# Patient Record
Sex: Male | Born: 1970 | Hispanic: No | Marital: Married | Smoking: Current every day smoker
Health system: Southern US, Community
[De-identification: ages and names within clinical notes are randomized; demographics above are authoritative.]

## PROBLEM LIST (undated history)

## (undated) DIAGNOSIS — E7800 Pure hypercholesterolemia, unspecified: Secondary | ICD-10-CM

---
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
## 2008-03-05 DEATH — deceased

## 2020-04-29 ENCOUNTER — Ambulatory Visit: Payer: Self-pay | Admitting: Cardiology

## 2020-06-06 ENCOUNTER — Ambulatory Visit: Payer: Self-pay | Admitting: Cardiology

## 2020-08-14 ENCOUNTER — Other Ambulatory Visit: Payer: Self-pay | Admitting: Urgent Care

## 2020-08-14 DIAGNOSIS — R222 Localized swelling, mass and lump, trunk: Secondary | ICD-10-CM

## 2020-08-21 ENCOUNTER — Ambulatory Visit
Admission: RE | Admit: 2020-08-21 | Discharge: 2020-08-21 | Disposition: A | Discharge status: Home / Self Care | Payer: 59 | Source: Ambulatory Visit | Attending: Urgent Care | Admitting: Urgent Care

## 2020-08-21 DIAGNOSIS — R222 Localized swelling, mass and lump, trunk: Secondary | ICD-10-CM

## 2021-02-24 DIAGNOSIS — D125 Benign neoplasm of sigmoid colon: Secondary | ICD-10-CM | POA: Diagnosis not present

## 2021-02-24 DIAGNOSIS — Z1211 Encounter for screening for malignant neoplasm of colon: Secondary | ICD-10-CM | POA: Diagnosis not present

## 2021-02-24 DIAGNOSIS — K5730 Diverticulosis of large intestine without perforation or abscess without bleeding: Secondary | ICD-10-CM | POA: Diagnosis not present

## 2021-02-24 DIAGNOSIS — K648 Other hemorrhoids: Secondary | ICD-10-CM | POA: Diagnosis not present

## 2021-02-26 DIAGNOSIS — D125 Benign neoplasm of sigmoid colon: Secondary | ICD-10-CM | POA: Diagnosis not present

## 2021-04-14 DIAGNOSIS — L239 Allergic contact dermatitis, unspecified cause: Secondary | ICD-10-CM | POA: Diagnosis not present

## 2021-04-14 DIAGNOSIS — E785 Hyperlipidemia, unspecified: Secondary | ICD-10-CM | POA: Diagnosis not present

## 2021-04-14 DIAGNOSIS — Z0001 Encounter for general adult medical examination with abnormal findings: Secondary | ICD-10-CM | POA: Diagnosis not present

## 2021-06-09 ENCOUNTER — Ambulatory Visit (HOSPITAL_COMMUNITY)
Admission: RE | Admit: 2021-06-09 | Discharge: 2021-06-09 | Disposition: A | Discharge status: Home / Self Care | Payer: 59 | Source: Ambulatory Visit | Attending: Gerontology | Admitting: Gerontology

## 2021-06-09 ENCOUNTER — Other Ambulatory Visit (HOSPITAL_COMMUNITY): Payer: Self-pay | Admitting: Gerontology

## 2021-06-09 DIAGNOSIS — R059 Cough, unspecified: Secondary | ICD-10-CM | POA: Insufficient documentation

## 2021-06-09 DIAGNOSIS — E785 Hyperlipidemia, unspecified: Secondary | ICD-10-CM | POA: Diagnosis not present

## 2021-06-09 DIAGNOSIS — R918 Other nonspecific abnormal finding of lung field: Secondary | ICD-10-CM | POA: Diagnosis not present

## 2021-06-09 DIAGNOSIS — R69 Illness, unspecified: Secondary | ICD-10-CM | POA: Diagnosis not present

## 2021-06-09 DIAGNOSIS — M47814 Spondylosis without myelopathy or radiculopathy, thoracic region: Secondary | ICD-10-CM | POA: Diagnosis not present

## 2021-06-09 DIAGNOSIS — J309 Allergic rhinitis, unspecified: Secondary | ICD-10-CM | POA: Diagnosis not present

## 2021-08-08 ENCOUNTER — Ambulatory Visit
Admission: EM | Admit: 2021-08-08 | Discharge: 2021-08-08 | Disposition: A | Discharge status: Home / Self Care | Payer: 59 | Attending: Nurse Practitioner | Admitting: Nurse Practitioner

## 2021-08-08 ENCOUNTER — Ambulatory Visit (INDEPENDENT_AMBULATORY_CARE_PROVIDER_SITE_OTHER): Payer: 59

## 2021-08-08 ENCOUNTER — Encounter: Payer: Self-pay | Admitting: Emergency Medicine

## 2021-08-08 ENCOUNTER — Other Ambulatory Visit: Payer: Self-pay

## 2021-08-08 DIAGNOSIS — K8020 Calculus of gallbladder without cholecystitis without obstruction: Secondary | ICD-10-CM | POA: Insufficient documentation

## 2021-08-08 DIAGNOSIS — R1012 Left upper quadrant pain: Secondary | ICD-10-CM | POA: Insufficient documentation

## 2021-08-08 DIAGNOSIS — M546 Pain in thoracic spine: Secondary | ICD-10-CM | POA: Diagnosis not present

## 2021-08-08 DIAGNOSIS — R198 Other specified symptoms and signs involving the digestive system and abdomen: Secondary | ICD-10-CM | POA: Insufficient documentation

## 2021-08-08 DIAGNOSIS — R140 Abdominal distension (gaseous): Secondary | ICD-10-CM | POA: Diagnosis not present

## 2021-08-08 HISTORY — DX: Pure hypercholesterolemia, unspecified: E78.00

## 2021-08-08 LAB — POCT URINALYSIS DIP (MANUAL ENTRY)
Bilirubin, UA: NEGATIVE
Glucose, UA: NEGATIVE mg/dL
Ketones, POC UA: NEGATIVE mg/dL
Leukocytes, UA: NEGATIVE
Nitrite, UA: NEGATIVE
Protein Ur, POC: NEGATIVE mg/dL
Spec Grav, UA: >=1.03 — AB (ref 1.010–1.025)
Urobilinogen, UA: 0.2 E.U./dL
pH, UA: 6 (ref 5.0–8.0)

## 2021-08-08 MED ORDER — ALUM & MAG HYDROXIDE-SIMETH 200-200-20 MG/5ML PO SUSP
30.0000 mL | Freq: Once | ORAL | Status: AC
Start: 2021-08-08 — End: 2021-08-08
  Administered 2021-08-08: 30 mL via ORAL

## 2021-08-08 MED ORDER — IBUPROFEN 800 MG PO TABS: 800.0000 mg | ORAL_TABLET | Freq: Three times a day (TID) | ORAL | 0 refills | Status: DC

## 2021-08-08 MED ORDER — PANTOPRAZOLE SODIUM 40 MG PO TBEC: 40.0000 mg | DELAYED_RELEASE_TABLET | Freq: Every day | ORAL | 0 refills | Status: DC

## 2021-08-08 NOTE — Discharge Instructions (Signed)
Your x-rays show that you have a gallstone.  I would like for you to follow-up with your primary care physician within the next 5 to 7 days for further evaluation and management. Urinalysis also shows blood.  A urine culture has been ordered.  You will be contacted if the urine culture is positive to provide treatment. Take medication as prescribed. As discussed, it may be helpful for you to take turmeric to help with pain and inflammation in your back. Gentle range of motion exercises to help with your back pain. May apply ice or heat as needed.  Apply ice for pain or swelling, heat for spasm or stiffness for your back pain. Go to the emergency department immediately if you develop fever, chills, worsening abdominal pain, nausea, vomiting, or diarrhea. Follow-up as needed.

## 2021-08-08 NOTE — ED Provider Notes (Addendum)
[facility]    CSN: [id number] Arrival date & time: 08/08/21  1508      History   Chief Complaint Chief Complaint  Patient presents with   Back Pain    Entered by patient    HPI Adam Yates is a 51 y.o. male.   The history is provided by the patient.   Patient presents for complaints of left upper quadrant abdominal pain that has been present for the past year.  Patient reports history of left lower back pain and left upper quadrant pain.  He states the left upper quadrant pain has worsened over the past 4 days.  He complains of bloating and gas after eating.  He states the pain feels like "pressure".  Patient states that he has been diagnosed with diverticulosis in the past.  He has had a colonoscopy, which was negative.  He also states that he was treated for his symptoms with muscle relaxers and steroids.  He states neither of these medications helped his symptoms.  He denies fever, chills, nausea, vomiting, diarrhea, urinary symptoms, loss of bowel or bladder function, or radiation of pain into his lower extremities.  Reports his bowel pattern is normal, last bowel movement was today.  Past Medical History:  Diagnosis Date   High cholesterol     There are no problems to display for this patient.   History reviewed. No pertinent surgical history.     Home Medications    Prior to Admission medications   Medication Sig Start Date End Date Taking? Authorizing Provider  ibuprofen (ADVIL) 800 MG tablet Take 1 tablet (800 mg total) by mouth 3 (three) times daily. 08/08/21  Yes Fredricka Kohrs-Warren, Sadie Haber, NP  pantoprazole (PROTONIX) 40 MG tablet Take 1 tablet (40 mg total) by mouth daily. 08/08/21  Yes Keona Bilyeu-Warren, Sadie Haber, NP    Family History History reviewed. No pertinent family history.  Social History Social History   Tobacco Use   Smoking status: Every Day    Packs/day: 0.50    Types: Cigarettes   Smokeless tobacco: Never  Substance Use Topics    Alcohol use: Never   Drug use: Never     Allergies   Avelox [moxifloxacin]   Review of Systems Review of Systems Per HPI  Physical Exam Triage Vital Signs ED Triage Vitals  Enc Vitals Group     BP 08/08/21 1528 130/77     Pulse Rate 08/08/21 1528 (!) 103     Resp 08/08/21 1528 15     Temp 08/08/21 1528 99.3 F (37.4 C)     Temp Source 08/08/21 1528 Oral     SpO2 08/08/21 1528 97 %     Weight --      Height --      Head Circumference --      Peak Flow --      Pain Score 08/08/21 1532 7     Pain Loc --      Pain Edu? --      Excl. in GC? --    No data found.  Updated Vital Signs BP 130/77 (BP Location: Right Arm)   Pulse (!) 103   Temp 99.3 F (37.4 C) (Oral)   Resp 15   SpO2 97%   Visual Acuity Right Eye Distance:   Left Eye Distance:   Bilateral Distance:    Right Eye Near:   Left Eye Near:    Bilateral Near:     Physical Exam Vitals and nursing note  reviewed.  Constitutional:      Appearance: Normal appearance.  HENT:     Head: Normocephalic.  Eyes:     Extraocular Movements: Extraocular movements intact.     Pupils: Pupils are equal, round, and reactive to light.  Cardiovascular:     Rate and Rhythm: Normal rate and regular rhythm.     Pulses: Normal pulses.     Heart sounds: Normal heart sounds.  Pulmonary:     Effort: Pulmonary effort is normal.     Breath sounds: Normal breath sounds.  Abdominal:     General: Bowel sounds are normal.     Palpations: Abdomen is soft.     Tenderness: There is abdominal tenderness (LUQ). There is no guarding.     Comments: Patient noted improvement of symptoms after administration of Maalox.  Musculoskeletal:     Cervical back: Normal range of motion.     Thoracic back: Tenderness (left thoracic spine tenderness at T7-T9) present. No swelling or deformity. Normal range of motion.  Lymphadenopathy:     Cervical: No cervical adenopathy.  Skin:    General: Skin is warm and dry.  Neurological:      General: No focal deficit present.     Mental Status: He is alert and oriented to person, place, and time.  Psychiatric:        Mood and Affect: Mood normal.        Behavior: Behavior normal.      UC Treatments / Results  Labs (all labs ordered are listed, but only abnormal results are displayed) Labs Reviewed  POCT URINALYSIS DIP (MANUAL ENTRY) - Abnormal; Notable for the following components:      Result Value   Spec Grav, UA >=1.030 (*)    Blood, UA trace-intact (*)    All other components within normal limits  URINE CULTURE    EKG   Radiology DG Abd 1 View  Result Date: 08/08/2021 CLINICAL DATA:  Left upper quadrant abdominal pain. EXAM: ABDOMEN - 1 VIEW COMPARISON:  None Available. FINDINGS: The bowel gas pattern is normal. There is average stool burden. There is a rounded density in the right upper quadrant measuring 1.9 cm in diameter. The osseous structures are within normal limits. IMPRESSION: 1. Likely gallstone in the right upper quadrant. This can be further evaluated with ultrasound if clinically warranted. 2. Nonobstructive bowel gas pattern. Electronically Signed   By: Darliss Cheney M.D.   On: 08/08/2021 16:12    Procedures Procedures (including critical care time)  Medications Ordered in UC Medications  alum & mag hydroxide-simeth (MAALOX/MYLANTA) 200-200-20 MG/5ML suspension 30 mL (30 mLs Oral Given 08/08/21 1557)    Initial Impression / Assessment and Plan / UC Course  I have reviewed the triage vital signs and the nursing notes.  Pertinent labs & imaging results that were available during my care of the patient were reviewed by me and considered in my medical decision making (see chart for details).  Patient presents for complaints of left upper quadrant abdominal pain and left middle back pain.  Patient states his symptoms have been present for the past year but worsened over the past 4 days.  On exam, patient has tenderness to the left upper quadrant into  his left paraspinal muscles T7-T9.  X-ray showed a gallstone.  His urine was positive for blood.  Urine culture is pending.  Discussion with patient regarding these findings.  Patient does not display any evidence of cholecystitis or obstruction.  Patient noted some relief  with the administration of Maalox.  We will treat patient symptomatically with ibuprofen and pantoprazole. For his back pain, also recommended the use of turmeric to help with inflammation.  Discussed indications with the patient of when to go to the emergency department, recommended that he go sooner if needed..  Patient was advised to follow-up with his primary care physician within the next 5 to 7 days. Final Clinical Impressions(s) / UC Diagnoses   Final diagnoses:  Abdominal pain, left upper quadrant  Abdominal bloating  Left-sided thoracic back pain, unspecified chronicity  Calculus of gallbladder without cholecystitis without obstruction  Symptoms of gastroesophageal reflux     Discharge Instructions      Your x-rays show that you have a gallstone.  I would like for you to follow-up with your primary care physician within the next 5 to 7 days for further evaluation and management. Urinalysis also shows blood.  A urine culture has been ordered.  You will be contacted if the urine culture is positive to provide treatment. Take medication as prescribed. As discussed, it may be helpful for you to take turmeric to help with pain and inflammation in your back. Gentle range of motion exercises to help with your back pain. May apply ice or heat as needed.  Apply ice for pain or swelling, heat for spasm or stiffness for your back pain. Go to the emergency department immediately if you develop fever, chills, worsening abdominal pain, nausea, vomiting, or diarrhea. Follow-up as needed.      ED Prescriptions     Medication Sig Dispense Auth. Provider   ibuprofen (ADVIL) 800 MG tablet Take 1 tablet (800 mg total) by mouth  3 (three) times daily. 30 tablet Quinnton Bury-Warren, Sadie Haber, NP   pantoprazole (PROTONIX) 40 MG tablet Take 1 tablet (40 mg total) by mouth daily. 30 tablet Marieta Markov-Warren, Sadie Haber, NP      PDMP not reviewed this encounter.   Abran Cantor, NP 08/08/21 1652    Abran Cantor, NP 08/08/21 [phone]

## 2021-08-08 NOTE — ED Triage Notes (Signed)
Pt reports left flank pain that radiates to LUQ of abdomen after eating. Pt reports intermittent discomfort started approximately 4 days ago. Pt reports "I feel bloated after I eat". LBM this am. Denies fever, n/v.

## 2021-08-09 LAB — URINE CULTURE: Culture: NO GROWTH

## 2021-08-18 ENCOUNTER — Ambulatory Visit (HOSPITAL_COMMUNITY)
Admission: RE | Admit: 2021-08-18 | Discharge: 2021-08-18 | Disposition: A | Discharge status: Home / Self Care | Payer: 59 | Source: Ambulatory Visit | Attending: Internal Medicine | Admitting: Internal Medicine

## 2021-08-18 ENCOUNTER — Other Ambulatory Visit (HOSPITAL_COMMUNITY): Payer: Self-pay | Admitting: Internal Medicine

## 2021-08-18 DIAGNOSIS — M546 Pain in thoracic spine: Secondary | ICD-10-CM | POA: Diagnosis not present

## 2021-08-18 DIAGNOSIS — E785 Hyperlipidemia, unspecified: Secondary | ICD-10-CM | POA: Diagnosis not present

## 2021-08-18 DIAGNOSIS — M549 Dorsalgia, unspecified: Secondary | ICD-10-CM | POA: Insufficient documentation

## 2021-08-18 DIAGNOSIS — M47814 Spondylosis without myelopathy or radiculopathy, thoracic region: Secondary | ICD-10-CM | POA: Diagnosis not present

## 2021-08-18 DIAGNOSIS — R1013 Epigastric pain: Secondary | ICD-10-CM | POA: Diagnosis not present

## 2021-09-15 ENCOUNTER — Ambulatory Visit (HOSPITAL_COMMUNITY): Payer: 59 | Attending: Internal Medicine | Admitting: Physical Therapy

## 2021-09-15 ENCOUNTER — Encounter (HOSPITAL_COMMUNITY): Payer: Self-pay | Admitting: Physical Therapy

## 2021-09-15 DIAGNOSIS — R29898 Other symptoms and signs involving the musculoskeletal system: Secondary | ICD-10-CM | POA: Diagnosis not present

## 2021-09-15 DIAGNOSIS — M546 Pain in thoracic spine: Secondary | ICD-10-CM | POA: Insufficient documentation

## 2021-09-15 DIAGNOSIS — M6281 Muscle weakness (generalized): Secondary | ICD-10-CM | POA: Insufficient documentation

## 2021-09-15 NOTE — Therapy (Signed)
OUTPATIENT PHYSICAL THERAPY THORACOLUMBAR EVALUATION   Patient Name: Adam Yates MRN: [id number] DOB:1970/04/18, 51 y.o., male Today's Date: 09/15/2021   PT End of Session - 09/15/21 0812     Visit Number 1    Number of Visits 6    Date for PT Re-Evaluation 10/27/21    Authorization Type [employer]  (VL 35, no auth)    Authorization - Visit Number 1    Authorization - Number of Visits 35    PT Start Time 0815    PT Stop Time 0853    PT Time Calculation (min) 38 min    Activity Tolerance Patient tolerated treatment well    Behavior During Therapy WFL for tasks assessed/performed             Past Medical History:  Diagnosis Date   High cholesterol    History reviewed. No pertinent surgical history. There are no problems to display for this patient.   PCP: Adam Spar, MD   REFERRING PROVIDER: [name], MD   REFERRING DIAG: back pain   Rationale for Evaluation and Treatment Rehabilitation  THERAPY DIAG:  Pain in thoracic spine  Muscle weakness (generalized)  Other symptoms and signs involving the musculoskeletal system  ONSET DATE: about 1 year  SUBJECTIVE:                                                                                                                                                                                           SUBJECTIVE STATEMENT: Patient states back pain and also some chest and L shoulder pain. Symptoms began about a year ago and have been off an on. 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 He also has has chest pain that wraps around from back into L chest.   PERTINENT HISTORY:  Hx back pain  PAIN:  Are you having pain? Yes: NPRS scale: 5-6/10 Pain location: L chest/back Pain description: pressure Aggravating factors: bending, lifting, working Relieving factors: none   PRECAUTIONS: None  WEIGHT BEARING RESTRICTIONS No  FALLS:  Has patient fallen in last 6 months? No  LIVING  ENVIRONMENT: Lives with: lives with their son Lives in: House/apartment Stairs: No Has following equipment at home: None  OCCUPATION: [name]  PLOF: Independent  PATIENT GOALS decrease pain and straighten up back    OBJECTIVE:   DIAGNOSTIC FINDINGS:  XR 08/18/21 IMPRESSION: 1. 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 No acute bony abnormality.  PATIENT SURVEYS:  FOTO 72% function  SCREENING FOR RED FLAGS: Bowel or bladder incontinence: No Spinal tumors: No Cauda equina syndrome: No Compression fracture: No Abdominal aneurysm:  No  COGNITION:  Overall cognitive status: Within functional limits for tasks assessed     SENSATION: WFL   POSTURE: rounded shoulders and forward head  PALPATION: TTP CPA T7-T9 with concordant symptoms, grossly hypomobile mid t/sp  THORACOLUMBAR ROM:   Active  A/PROM  eval  Flexion 0% limited  Extension 0% limited  Right lateral flexion 0% limited  Left lateral flexion 0% limited  Right rotation 25% limited  Left rotation 25% limited   (Blank rows = not tested)  LOWER EXTREMITY ROM:   WFL for tasks assessed  Active  Right eval Left eval  Hip flexion    Hip extension    Hip abduction    Hip adduction    Hip internal rotation    Hip external rotation    Knee flexion    Knee extension    Ankle dorsiflexion    Ankle plantarflexion    Ankle inversion    Ankle eversion     (Blank rows = not tested)  LOWER EXTREMITY MMT:    MMT Right eval Left eval  Hip flexion 5 5  Hip extension 4+ 4+  Hip abduction 4+ 4+  Hip adduction    Hip internal rotation    Hip external rotation    Knee flexion 5 5  Knee extension 5 5  Ankle dorsiflexion    Ankle plantarflexion    Ankle inversion    Ankle eversion     (Blank rows = not tested)  UPPER EXTREMITY ROM: WFL for tasks assessed  UPPER EXTREMITY MMT: MMT Right 09/15/2021 Left 09/15/2021  Shoulder flexion 5 5  Shoulder extension    Shoulder abduction 5 4  Shoulder adduction     Shoulder internal rotation 5 5  Shoulder external rotation 5 4  Middle trapezius    Lower trapezius    Elbow flexion    Elbow extension    Wrist flexion    Wrist extension    Wrist ulnar deviation    Wrist radial deviation    Wrist pronation    Wrist supination    Grip strength (lbs)    (Blank rows = not tested) *= pain     GAIT: Distance walked: 100 feet Assistive device utilized: None Level of assistance: Complete Independence Comments: WFL    TODAY'S TREATMENT  09/15/21 Thoracic extension over chair 10 x 5 second holds Doorway pec stretch 5 x 20 second holds Quadruped thoracic rotation 10 x 5 second holds bilateral    PATIENT EDUCATION:  Education details: Patient educated on exam findings, POC, scope of PT, HEP, and posture. Person educated: Patient Education method: Explanation, Demonstration, and Handouts Education comprehension: verbalized understanding, returned demonstration, verbal cues required, and tactile cues required    HOME EXERCISE PROGRAM: Access Code: [email or web address] URL: [email or web address] Date: 09/15/2021 - Seated Thoracic Lumbar Extension with Pectoralis Stretch  - 3 x daily - 7 x weekly - 2 sets - 10 reps - 5 second hold - Doorway Pec Stretch at 90 Degrees Abduction  - 3 x daily - 7 x weekly - 5 reps - 20 second hold - Quadruped Thoracic Rotation with Hand on Neck  - 3 x daily - 7 x weekly - 2 sets - 10 reps - 5 second  hold  ASSESSMENT:  CLINICAL IMPRESSION: Patient a 51 y.o. y.o. male who was seen today for physical therapy evaluation and treatment for back pain. Patient presents with pain and deficits in thoracic spine strength, ROM, endurance, activity tolerance, and functional mobility with ADL. Patient  is having to modify and restrict ADL as indicated by outcome measure score as well as subjective information and objective measures which is affecting overall participation. Patient will benefit from skilled physical therapy  in order to improve function and reduce impairment.   OBJECTIVE IMPAIRMENTS decreased activity tolerance, decreased endurance, decreased mobility, difficulty walking, decreased ROM, decreased strength, hypomobility, increased muscle spasms, impaired flexibility, impaired UE functional use, improper body mechanics, postural dysfunction, and pain.   ACTIVITY LIMITATIONS carrying, lifting, reach over head, locomotion level, and caring for others  PARTICIPATION LIMITATIONS: cleaning, community activity, occupation, and yard work  PERSONAL FACTORS Time since onset of injury/illness/exacerbation and 1 comorbidity: hx back pain  are also affecting patient's functional outcome.   REHAB POTENTIAL: Good  CLINICAL DECISION MAKING: Stable/uncomplicated  EVALUATION COMPLEXITY: Low   GOALS: Goals reviewed with patient? Yes  SHORT TERM GOALS: Target date: 10/06/2021  Patient will be independent with HEP in order to improve functional outcomes. Baseline:  Goal status: INITIAL  2.  Patient will report at least 25% improvement in symptoms for improved quality of life. Baseline:  Goal status: INITIAL   LONG TERM GOALS: Target date: 10/27/2021  Patient will report at least 75% improvement in symptoms for improved quality of life. Baseline:  Goal status: INITIAL  2.  Patient will improve FOTO score by at least 7 points in order to indicate improved tolerance to activity. Baseline: 72% function Goal status: INITIAL  3.  Patient will demonstrate upright posture for at least 10 minutes without cueing for improved postural awareness and strength. Baseline: forward head, rounded shoulders, kyphotic Goal status: INITIAL  4.  Patient will be able to return to all activities unrestricted for improved ability to perform work functions and participate with family.  Baseline: restricted Goal status: INITIAL     PLAN: PT FREQUENCY: 1x/week  PT DURATION: 6 weeks  PLANNED INTERVENTIONS:  Therapeutic exercises, Therapeutic activity, Neuromuscular re-education, Balance training, Gait training, Patient/Family education, Joint manipulation, Joint mobilization, Stair training, Orthotic/Fit training, DME instructions, Aquatic Therapy, Dry Needling, Electrical stimulation, Spinal manipulation, Spinal mobilization, Cryotherapy, Moist heat, Compression bandaging, scar mobilization, Splintting, Taping, Traction, Ultrasound, Ionotophoresis 4mg /ml Dexamethasone, and Manual therapy   PLAN FOR NEXT SESSION: f/u with HEP, thoracic mobility, postural strengthening, pec length, possibly thoracic mobs   Moua Rasmusson, PT 09/15/2021, 9:01 AM

## 2021-09-22 ENCOUNTER — Ambulatory Visit (HOSPITAL_COMMUNITY): Payer: 59

## 2021-09-22 DIAGNOSIS — M546 Pain in thoracic spine: Secondary | ICD-10-CM | POA: Diagnosis not present

## 2021-09-22 DIAGNOSIS — R29898 Other symptoms and signs involving the musculoskeletal system: Secondary | ICD-10-CM | POA: Diagnosis not present

## 2021-09-22 DIAGNOSIS — M6281 Muscle weakness (generalized): Secondary | ICD-10-CM | POA: Diagnosis not present

## 2021-09-22 NOTE — Therapy (Signed)
OUTPATIENT PHYSICAL THERAPY THORACOLUMBAR PROGRESS   Patient Name: Adam Yates MRN: [id number] DOB:1970-03-06, 51 y.o., male Today's Date: 09/22/2021   PT End of Session - 09/22/21 1559     Visit Number 2    Number of Visits 6    Date for PT Re-Evaluation 10/27/21    Authorization Type [employer]  (VL [id number], no auth)    Authorization - Visit Number 1    Authorization - Number of Visits 35    PT Start Time 1600    PT Stop Time 1642    PT Time Calculation (min) 42 min    Activity Tolerance Patient tolerated treatment well    Behavior During Therapy WFL for tasks assessed/performed              Past Medical History:  Diagnosis Date   High cholesterol    No past surgical history on file. There are no problems to display for this patient.   PCP: [name], MD   REFERRING PROVIDER: [name], MD   REFERRING DIAG: back pain   Rationale for Evaluation and Treatment Rehabilitation  THERAPY DIAG:  Pain in thoracic spine  Muscle weakness (generalized)  Other symptoms and signs involving the musculoskeletal system  ONSET DATE: about 1 year  SUBJECTIVE:                                                                                                                                                                                           SUBJECTIVE STATEMENT:  Patient reports he felt a little better after treatment but did some handyman work over the weekend and it flared back up some.   PERTINENT HISTORY:  Hx back pain  PAIN:  Are you having pain? Yes: NPRS scale: 6/10 Pain location: L chest/back Pain description: pressure Aggravating factors: bending, lifting, working Relieving factors: none   PRECAUTIONS: None  WEIGHT BEARING RESTRICTIONS No  FALLS:  Has patient fallen in last 6 months? No  LIVING ENVIRONMENT: Lives with: lives with their son Lives in: House/apartment Stairs: No Has following equipment at home:  None  OCCUPATION: [name]  PLOF: Independent  PATIENT GOALS decrease pain and straighten up back    OBJECTIVE:   DIAGNOSTIC FINDINGS:  XR 08/18/21 IMPRESSION: 1. 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 No acute bony abnormality.  PATIENT SURVEYS:  FOTO 72% function  SCREENING FOR RED FLAGS: Bowel or bladder incontinence: No Spinal tumors: No Cauda equina syndrome: No Compression fracture: No Abdominal aneurysm: No  COGNITION:  Overall cognitive status: Within functional limits for tasks assessed     SENSATION: WFL   POSTURE: rounded shoulders and  forward head  PALPATION: TTP CPA T7-T9 with concordant symptoms, grossly hypomobile mid t/sp  THORACOLUMBAR ROM:   Active  A/PROM  eval  Flexion 0% limited  Extension 0% limited  Right lateral flexion 0% limited  Left lateral flexion 0% limited  Right rotation 25% limited  Left rotation 25% limited   (Blank rows = not tested)  LOWER EXTREMITY ROM:   WFL for tasks assessed  Active  Right eval Left eval  Hip flexion    Hip extension    Hip abduction    Hip adduction    Hip internal rotation    Hip external rotation    Knee flexion    Knee extension    Ankle dorsiflexion    Ankle plantarflexion    Ankle inversion    Ankle eversion     (Blank rows = not tested)  LOWER EXTREMITY MMT:    MMT Right eval Left eval  Hip flexion 5 5  Hip extension 4+ 4+  Hip abduction 4+ 4+  Hip adduction    Hip internal rotation    Hip external rotation    Knee flexion 5 5  Knee extension 5 5  Ankle dorsiflexion    Ankle plantarflexion    Ankle inversion    Ankle eversion     (Blank rows = not tested)  UPPER EXTREMITY ROM: WFL for tasks assessed  UPPER EXTREMITY MMT: MMT Right 09/15/2021 Left 09/15/2021  Shoulder flexion 5 5  Shoulder extension    Shoulder abduction 5 4  Shoulder adduction    Shoulder internal rotation 5 5  Shoulder external rotation 5 4  Middle trapezius    Lower trapezius    Elbow  flexion    Elbow extension    Wrist flexion    Wrist extension    Wrist ulnar deviation    Wrist radial deviation    Wrist pronation    Wrist supination    Grip strength (lbs)    (Blank rows = not tested) *= pain     GAIT: Distance walked: 100 feet Assistive device utilized: None Level of assistance: Complete Independence Comments: WFL    TODAY'S TREATMENT  09/22/21 Sitting: Thoracic extension over chair 5" x 10  Standing: Doorway pec stretch 5 x 20 second holds  Quadruped  thoracic rotation 10 x 5 second holds bilateral   Sidelying thoracic rotation/ open book stretch x 10 each side  Prone: Prone press up x 10 Prone Grade 2 mobilizations to thoracic spine x 10  Supine: On 1/2 foam roll stretch On 1/2 foam roll GTB shoulder horizontal abduction 2 x 10 On 1/2 foam roll  with roll horizontal x 10 stretch     09/15/21 Thoracic extension over chair 10 x 5 second holds Doorway pec stretch 5 x 20 second holds Quadruped thoracic rotation 10 x 5 second holds bilateral    PATIENT EDUCATION:  Education details: Patient educated on exam findings, POC, scope of PT, HEP, and posture. Person educated: Patient Education method: Explanation, Demonstration, and Handouts Education comprehension: verbalized understanding, returned demonstration, verbal cues required, and tactile cues required    HOME EXERCISE PROGRAM: Access Code: [email or web address] URL: [email or web address] Date: 09/22/2021 Prepared by: AP - Rehab  Exercises - Seated Thoracic Lumbar Extension with Pectoralis Stretch  - 3 x daily - 7 x weekly - 2 sets - 10 reps - 5 second hold - Doorway Pec Stretch at 90 Degrees Abduction  - 3 x daily - 7 x weekly - 5 reps - 20 second  hold - Architect with Hand on Neck  - 3 x daily - 7 x weekly - 2 sets - 10 reps - 5 second  hold - Sidelying Open Book Thoracic Lumbar Rotation and Extension  - 3 x daily - 7 x weekly - 1 sets - 10 reps - Prone  Press Up  - 1 x daily - 7 x weekly - 1 sets - 10 reps - Supine Chest Stretch on Foam Roll  - 1 x daily - 7 x weekly - 1 sets - 10 reps - Supine Thoracic Mobilization Foam Roll Horizontal with Arm Stretch  - 1 x daily - 7 x weekly - 1 sets - 10 reps  Access Code: [email or web address] URL: [email or web address] Date: 09/15/2021 - Seated Thoracic Lumbar Extension with Pectoralis Stretch  - 3 x daily - 7 x weekly - 2 sets - 10 reps - 5 second hold - Doorway Pec Stretch at 90 Degrees Abduction  - 3 x daily - 7 x weekly - 5 reps - 20 second hold - Quadruped Thoracic Rotation with Hand on Neck  - 3 x daily - 7 x weekly - 2 sets - 10 reps - 5 second  hold  ASSESSMENT:  CLINICAL IMPRESSION: Today's session started with review of HEP and goals.  Patient verbalizes agreement with set rehab goals. Added exercise to continue to address thoracic mobility; gentle anterior mobs of the thoracic spine; updated HEP. Patient reports no rib/ chest area pain after treatment only some soreness in his back. Patient will benefit from skilled physical therapy in order to improve function and reduce impairment.   OBJECTIVE IMPAIRMENTS decreased activity tolerance, decreased endurance, decreased mobility, difficulty walking, decreased ROM, decreased strength, hypomobility, increased muscle spasms, impaired flexibility, impaired UE functional use, improper body mechanics, postural dysfunction, and pain.   ACTIVITY LIMITATIONS carrying, lifting, reach over head, locomotion level, and caring for others  PARTICIPATION LIMITATIONS: cleaning, community activity, occupation, and yard work  PERSONAL FACTORS Time since onset of injury/illness/exacerbation and 1 comorbidity: hx back pain  are also affecting patient's functional outcome.   REHAB POTENTIAL: Good  CLINICAL DECISION MAKING: Stable/uncomplicated  EVALUATION COMPLEXITY: Low   GOALS: Goals reviewed with patient? Yes  SHORT TERM GOALS: Target date:  10/06/2021  Patient will be independent with HEP in order to improve functional outcomes. Baseline:  Goal status: ongoing  2.  Patient will report at least 25% improvement in symptoms for improved quality of life. Baseline:  Goal status: ongoing   LONG TERM GOALS: Target date: 10/27/2021  Patient will report at least 75% improvement in symptoms for improved quality of life. Baseline:  Goal status: ongoing  2.  Patient will improve FOTO score by at least 7 points in order to indicate improved tolerance to activity. Baseline: 72% function Goal status: ongoing  3.  Patient will demonstrate upright posture for at least 10 minutes without cueing for improved postural awareness and strength. Baseline: forward head, rounded shoulders, kyphotic Goal status: ongoing  4.  Patient will be able to return to all activities unrestricted for improved ability to perform work functions and participate with family.  Baseline: restricted Goal status: ongoing     PLAN: PT FREQUENCY: 1x/week  PT DURATION: 6 weeks  PLANNED INTERVENTIONS: Therapeutic exercises, Therapeutic activity, Neuromuscular re-education, Balance training, Gait training, Patient/Family education, Joint manipulation, Joint mobilization, Stair training, Orthotic/Fit training, DME instructions, Aquatic Therapy, Dry Needling, Electrical stimulation, Spinal manipulation, Spinal mobilization, Cryotherapy, Moist heat, Compression bandaging, scar mobilization, Splintting, Taping, Traction, Ultrasound,  Ionotophoresis 4mg /ml Dexamethasone, and Manual therapy   PLAN FOR NEXT SESSION: f/u with HEP, thoracic mobility, postural strengthening, pec length, possibly thoracic mobs   4:44 PM, 09/22/21 Zane Samson Small Karver Fadden MPT [facility] [place] [phone] Ph:[phone]

## 2021-09-29 ENCOUNTER — Ambulatory Visit (HOSPITAL_COMMUNITY): Payer: 59

## 2021-10-13 ENCOUNTER — Ambulatory Visit (HOSPITAL_COMMUNITY): Payer: 59 | Attending: Internal Medicine | Admitting: Physical Therapy

## 2021-10-13 ENCOUNTER — Encounter (HOSPITAL_COMMUNITY): Payer: Self-pay | Admitting: Physical Therapy

## 2021-10-13 DIAGNOSIS — M546 Pain in thoracic spine: Secondary | ICD-10-CM | POA: Diagnosis not present

## 2021-10-13 DIAGNOSIS — R29898 Other symptoms and signs involving the musculoskeletal system: Secondary | ICD-10-CM | POA: Diagnosis not present

## 2021-10-13 DIAGNOSIS — M6281 Muscle weakness (generalized): Secondary | ICD-10-CM | POA: Insufficient documentation

## 2021-10-13 NOTE — Therapy (Signed)
OUTPATIENT PHYSICAL THERAPY THORACOLUMBAR PROGRESS   Patient Name: Adam Yates MRN: [id number] DOB:December 18, 1970, 51 y.o., male Today's Date: 10/13/2021   PT End of Session - 10/13/21 1434     Visit Number 3    Number of Visits 6    Date for PT Re-Evaluation 10/27/21    Authorization Type [employer]et[employer]  (VL 35, no auth)    Authorization - Visit Number 3    Authorization - Number of Visits 35    PT Start Time 1434    PT Stop Time 1512    PT Time Calculation (min) 38 min    Activity Tolerance Patient tolerated treatment well    Behavior During Therapy WFL for tasks assessed/performed              Past Medical History:  Diagnosis Date   High cholesterol    History reviewed. No pertinent surgical history. There are no problems to display for this patient.   PCP: [name], MD   REFERRING PROVIDER: [name], MD   REFERRING DIAG: back pain   Rationale for Evaluation and Treatment Rehabilitation  THERAPY DIAG:  Pain in thoracic spine  Muscle weakness (generalized)  Other symptoms and signs involving the musculoskeletal system  ONSET DATE: about 1 year  SUBJECTIVE:                                                                                                                                                                                           SUBJECTIVE STATEMENT: Patient states his R shoulder has been bothering him. Some days his chest/back doesn't bother him and some days it does. Exercises have been going well but the shoulder hurts from horizontal abduction exercise.   PERTINENT HISTORY:  Hx back pain  PAIN:  Are you having pain? Yes: NPRS scale: 6/10 Pain location: L chest/back Pain description: pressure Aggravating factors: bending, lifting, working Relieving factors: none   PRECAUTIONS: None  WEIGHT BEARING RESTRICTIONS No  FALLS:  Has patient fallen in last 6 months? No  LIVING ENVIRONMENT: Lives with: lives with  their son Lives in: House/apartment Stairs: No Has following equipment at home: None  OCCUPATION: [name]  PLOF: Independent  PATIENT GOALS decrease pain and straighten up back    OBJECTIVE:   DIAGNOSTIC FINDINGS:  XR 08/18/21 IMPRESSION: 1. 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 No acute bony abnormality.  PATIENT SURVEYS:  FOTO 72% function  SCREENING FOR RED FLAGS: Bowel or bladder incontinence: No Spinal tumors: No Cauda equina syndrome: No Compression fracture: No Abdominal aneurysm: No  COGNITION:  Overall cognitive status: Within functional limits for tasks assessed  SENSATION: WFL   POSTURE: rounded shoulders and forward head  PALPATION: TTP CPA T7-T9 with concordant symptoms, grossly hypomobile mid t/sp  THORACOLUMBAR ROM:   Active  A/PROM  eval  Flexion 0% limited  Extension 0% limited  Right lateral flexion 0% limited  Left lateral flexion 0% limited  Right rotation 25% limited  Left rotation 25% limited   (Blank rows = not tested)  LOWER EXTREMITY ROM:   WFL for tasks assessed  Active  Right eval Left eval  Hip flexion    Hip extension    Hip abduction    Hip adduction    Hip internal rotation    Hip external rotation    Knee flexion    Knee extension    Ankle dorsiflexion    Ankle plantarflexion    Ankle inversion    Ankle eversion     (Blank rows = not tested)  LOWER EXTREMITY MMT:    MMT Right eval Left eval  Hip flexion 5 5  Hip extension 4+ 4+  Hip abduction 4+ 4+  Hip adduction    Hip internal rotation    Hip external rotation    Knee flexion 5 5  Knee extension 5 5  Ankle dorsiflexion    Ankle plantarflexion    Ankle inversion    Ankle eversion     (Blank rows = not tested)  UPPER EXTREMITY ROM: WFL for tasks assessed  UPPER EXTREMITY MMT: MMT Right 09/15/2021 Left 09/15/2021  Shoulder flexion 5 5  Shoulder extension    Shoulder abduction 5 4  Shoulder adduction    Shoulder internal rotation 5 5   Shoulder external rotation 5 4  Middle trapezius    Lower trapezius    Elbow flexion    Elbow extension    Wrist flexion    Wrist extension    Wrist ulnar deviation    Wrist radial deviation    Wrist pronation    Wrist supination    Grip strength (lbs)    (Blank rows = not tested) *= pain     GAIT: Distance walked: 100 feet Assistive device utilized: None Level of assistance: Complete Independence Comments: WFL    TODAY'S TREATMENT  10/13/21 Scapular retraction with GH ER GTB 2x 15 Shoulder horizontal abduction GTB 2 x 15 PNF D2 pattern GTB 2 x 10  Row GTB 2x 15 Shoulder extension GTB 2x 15  Seated cervical retraction 2x 15   09/22/21 Sitting: Thoracic extension over chair 5" x 10  Standing: Doorway pec stretch 5 x 20 second holds  Quadruped  thoracic rotation 10 x 5 second holds bilateral   Sidelying thoracic rotation/ open book stretch x 10 each side  Prone: Prone press up x 10 Prone Grade 2 mobilizations to thoracic spine x 10  Supine: On 1/2 foam roll stretch On 1/2 foam roll GTB shoulder horizontal abduction 2 x 10 On 1/2 foam roll  with roll horizontal x 10 stretch   09/15/21 Thoracic extension over chair 10 x 5 second holds Doorway pec stretch 5 x 20 second holds Quadruped thoracic rotation 10 x 5 second holds bilateral    PATIENT EDUCATION:  Education details: Patient educated on exam findings, POC, scope of PT, HEP, and posture. Person educated: Patient Education method: Explanation, Demonstration, and Handouts Education comprehension: verbalized understanding, returned demonstration, verbal cues required, and tactile cues required    HOME EXERCISE PROGRAM: Access Code: [email or web address] URL: [email or web address] 10/13/21- Shoulder External Rotation and Scapular Retraction with Resistance  - 1  x daily - 7 x weekly - 2 sets - 15 reps - Standing Shoulder Horizontal Abduction with Resistance  - 1 x daily - 7 x weekly - 2 sets - 15  reps - Standing Shoulder Single Arm PNF D2 Flexion with Resistance  - 1 x daily - 7 x weekly - 2 sets - 10 reps - Standing Shoulder Row with Anchored Resistance  - 1 x daily - 7 x weekly - 2 sets - 10 reps - Shoulder extension with resistance - Neutral  - 1 x daily - 7 x weekly - 2 sets - 15 reps   Date: 09/22/2021 Prepared by: AP - Rehab  Exercises - Seated Thoracic Lumbar Extension with Pectoralis Stretch  - 3 x daily - 7 x weekly - 2 sets - 10 reps - 5 second hold - Doorway Pec Stretch at 90 Degrees Abduction  - 3 x daily - 7 x weekly - 5 reps - 20 second hold - Quadruped Thoracic Rotation with Hand on Neck  - 3 x daily - 7 x weekly - 2 sets - 10 reps - 5 second  hold - Sidelying Open Book Thoracic Lumbar Rotation and Extension  - 3 x daily - 7 x weekly - 1 sets - 10 reps - Prone Press Up  - 1 x daily - 7 x weekly - 1 sets - 10 reps - Supine Chest Stretch on Foam Roll  - 1 x daily - 7 x weekly - 1 sets - 10 reps - Supine Thoracic Mobilization Foam Roll Horizontal with Arm Stretch  - 1 x daily - 7 x weekly - 1 sets - 10 reps  Access Code: [email or web address] URL: [email or web address] Date: 09/15/2021 - Seated Thoracic Lumbar Extension with Pectoralis Stretch  - 3 x daily - 7 x weekly - 2 sets - 10 reps - 5 second hold - Doorway Pec Stretch at 90 Degrees Abduction  - 3 x daily - 7 x weekly - 5 reps - 20 second hold - Quadruped Thoracic Rotation with Hand on Neck  - 3 x daily - 7 x weekly - 2 sets - 10 reps - 5 second  hold  ASSESSMENT:  CLINICAL IMPRESSION: Patient with increased R shoulder pain today but decreasing original symptoms. Began additional postural strengthening exercises which are tolerated well and he is given intermittent cueing to perform in pain reduced range. Patient educated on importance of posture on shoulder and cervical spine mechanics. He requires verbal and tactile cueing for cervical retractions with good/fair carry over. Patient will continue to benefit  from physical therapy in order to improve function and reduce impairment.    OBJECTIVE IMPAIRMENTS decreased activity tolerance, decreased endurance, decreased mobility, difficulty walking, decreased ROM, decreased strength, hypomobility, increased muscle spasms, impaired flexibility, impaired UE functional use, improper body mechanics, postural dysfunction, and pain.   ACTIVITY LIMITATIONS carrying, lifting, reach over head, locomotion level, and caring for others  PARTICIPATION LIMITATIONS: cleaning, community activity, occupation, and yard work  PERSONAL FACTORS Time since onset of injury/illness/exacerbation and 1 comorbidity: hx back pain  are also affecting patient's functional outcome.   REHAB POTENTIAL: Good  CLINICAL DECISION MAKING: Stable/uncomplicated  EVALUATION COMPLEXITY: Low   GOALS: Goals reviewed with patient? Yes  SHORT TERM GOALS: Target date: 10/06/2021  Patient will be independent with HEP in order to improve functional outcomes. Baseline:  Goal status: ongoing  2.  Patient will report at least 25% improvement in symptoms for improved quality of life. Baseline:  Goal status: ongoing  LONG TERM GOALS: Target date: 10/27/2021  Patient will report at least 75% improvement in symptoms for improved quality of life. Baseline:  Goal status: ongoing  2.  Patient will improve FOTO score by at least 7 points in order to indicate improved tolerance to activity. Baseline: 72% function Goal status: ongoing  3.  Patient will demonstrate upright posture for at least 10 minutes without cueing for improved postural awareness and strength. Baseline: forward head, rounded shoulders, kyphotic Goal status: ongoing  4.  Patient will be able to return to all activities unrestricted for improved ability to perform work functions and participate with family.  Baseline: restricted Goal status: ongoing     PLAN: PT FREQUENCY: 1x/week  PT DURATION: 6 weeks  PLANNED  INTERVENTIONS: Therapeutic exercises, Therapeutic activity, Neuromuscular re-education, Balance training, Gait training, Patient/Family education, Joint manipulation, Joint mobilization, Stair training, Orthotic/Fit training, DME instructions, Aquatic Therapy, Dry Needling, Electrical stimulation, Spinal manipulation, Spinal mobilization, Cryotherapy, Moist heat, Compression bandaging, scar mobilization, Splintting, Taping, Traction, Ultrasound, Ionotophoresis 4mg /ml Dexamethasone, and Manual therapy   PLAN FOR NEXT SESSION: f/u with HEP, thoracic mobility, postural strengthening, pec length, possibly thoracic mobs   2:35 PM, 10/13/21 12/13/21 PT, DPT Physical Therapist at [facility]

## 2021-10-20 ENCOUNTER — Ambulatory Visit (HOSPITAL_COMMUNITY): Payer: 59

## 2021-10-20 DIAGNOSIS — M6281 Muscle weakness (generalized): Secondary | ICD-10-CM | POA: Diagnosis not present

## 2021-10-20 DIAGNOSIS — M546 Pain in thoracic spine: Secondary | ICD-10-CM | POA: Diagnosis not present

## 2021-10-20 DIAGNOSIS — R29898 Other symptoms and signs involving the musculoskeletal system: Secondary | ICD-10-CM | POA: Diagnosis not present

## 2021-10-20 NOTE — Therapy (Signed)
OUTPATIENT PHYSICAL THERAPY THORACOLUMBAR PROGRESS   Patient Name: Adam Yates MRN: [id number] DOB:05-30-1970, 51 y.o., male Today's Date: 10/20/2021   PT End of Session - 10/20/21 1435     Visit Number 4    Number of Visits 6    Date for PT Re-Evaluation 10/27/21    Authorization Type [employer]  (VL 35, no auth)    Authorization - Visit Number 4    Authorization - Number of Visits 35    PT Start Time 1432    PT Stop Time 1514    PT Time Calculation (min) 42 min    Activity Tolerance Patient tolerated treatment well    Behavior During Therapy WFL for tasks assessed/performed               Past Medical History:  Diagnosis Date   High cholesterol    No past surgical history on file. There are no problems to display for this patient.   PCP: Carrolyn Meiers, MD   REFERRING PROVIDER: Carrolyn Meiers, MD   REFERRING DIAG: back pain   Rationale for Evaluation and Treatment Rehabilitation  THERAPY DIAG:  Pain in thoracic spine  Muscle weakness (generalized)  Other symptoms and signs involving the musculoskeletal system  ONSET DATE: about 1 year  SUBJECTIVE:                                                                                                                                                                                           SUBJECTIVE STATEMENT: Right shoulder "pressure" and pain; at about 50%; patient reports no pain chest and back today but rested over the weekend. Reports he purchased a 1/2 foam roll for home use.   PERTINENT HISTORY:  Hx back pain  PAIN:  Are you having pain? Yes: NPRS scale: 5/10 Pain location: right shoulder Pain description: pressure Aggravating factors: bending, lifting, working Relieving factors: none   PRECAUTIONS: None  WEIGHT BEARING RESTRICTIONS No  FALLS:  Has patient fallen in last 6 months? No  LIVING ENVIRONMENT: Lives with: lives with their son Lives in: House/apartment Stairs:  No Has following equipment at home: None  OCCUPATION: [employer]  PLOF: Independent  PATIENT GOALS decrease pain and straighten up back    OBJECTIVE:   DIAGNOSTIC FINDINGS:  XR 08/18/21 IMPRESSION: 1. 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 No acute bony abnormality.  PATIENT SURVEYS:  FOTO 72% function  SCREENING FOR RED FLAGS: Bowel or bladder incontinence: No Spinal tumors: No Cauda equina syndrome: No Compression fracture: No Abdominal aneurysm: No  COGNITION:  Overall cognitive status: Within functional limits for tasks assessed  SENSATION: WFL   POSTURE: rounded shoulders and forward head  PALPATION: TTP CPA T7-T9 with concordant symptoms, grossly hypomobile mid t/sp  THORACOLUMBAR ROM:   Active  A/PROM  eval  Flexion 0% limited  Extension 0% limited  Right lateral flexion 0% limited  Left lateral flexion 0% limited  Right rotation 25% limited  Left rotation 25% limited   (Blank rows = not tested)  LOWER EXTREMITY ROM:   WFL for tasks assessed  Active  Right eval Left eval  Hip flexion    Hip extension    Hip abduction    Hip adduction    Hip internal rotation    Hip external rotation    Knee flexion    Knee extension    Ankle dorsiflexion    Ankle plantarflexion    Ankle inversion    Ankle eversion     (Blank rows = not tested)  LOWER EXTREMITY MMT:    MMT Right eval Left eval  Hip flexion 5 5  Hip extension 4+ 4+  Hip abduction 4+ 4+  Hip adduction    Hip internal rotation    Hip external rotation    Knee flexion 5 5  Knee extension 5 5  Ankle dorsiflexion    Ankle plantarflexion    Ankle inversion    Ankle eversion     (Blank rows = not tested)  UPPER EXTREMITY ROM: WFL for tasks assessed  UPPER EXTREMITY MMT: MMT Right 09/15/2021 Left 09/15/2021  Shoulder flexion 5 5  Shoulder extension    Shoulder abduction 5 4  Shoulder adduction    Shoulder internal rotation 5 5  Shoulder external rotation 5 4  Middle  trapezius    Lower trapezius    Elbow flexion    Elbow extension    Wrist flexion    Wrist extension    Wrist ulnar deviation    Wrist radial deviation    Wrist pronation    Wrist supination    Grip strength (lbs)    (Blank rows = not tested) *= pain     GAIT: Distance walked: 100 feet Assistive device utilized: None Level of assistance: Complete Independence Comments: WFL    TODAY'S TREATMENT  10/20/21 UBE backwards x 3'  Sitting: chair thoracic extension x 15 Cervical retractions x 15 physioball roll out (blue) for back flexion stretch x 2'  Supine: 1/2 foam roll horizontal stretch x 10 1/2 foam roll vertical stretch x 1' 1/2 foam roll vertical march with abdominal set  x 10 each  Standing: Open book stretch x 10 each Wall Pec stretch 10" hold x 5 each Doorway stretch 5 x 20" GTB ER 2 x 10  Prone: Thoracic mobs grade 2        10/13/21 Scapular retraction with GH ER GTB 2x 15 Shoulder horizontal abduction GTB 2 x 15 PNF D2 pattern GTB 2 x 10  Row GTB 2x 15 Shoulder extension GTB 2x 15  Seated cervical retraction 2x 15   09/22/21 Sitting: Thoracic extension over chair 5" x 10  Standing: Doorway pec stretch 5 x 20 second holds  Quadruped  thoracic rotation 10 x 5 second holds bilateral   Sidelying thoracic rotation/ open book stretch x 10 each side  Prone: Prone press up x 10 Prone Grade 2 mobilizations to thoracic spine x 10  Supine: On 1/2 foam roll stretch On 1/2 foam roll GTB shoulder horizontal abduction 2 x 10 On 1/2 foam roll  with roll horizontal x 10 stretch  09/15/21 Thoracic extension over chair 10 x 5 second holds Doorway pec stretch 5 x 20 second holds Quadruped thoracic rotation 10 x 5 second holds bilateral    PATIENT EDUCATION:  Education details: Patient educated on exam findings, POC, scope of PT, HEP, and posture. Person educated: Patient Education method: Explanation, Demonstration, and Handouts Education  comprehension: verbalized understanding, returned demonstration, verbal cues required, and tactile cues required    HOME EXERCISE PROGRAM: Access Code: [email or web address] URL: [email or web address] 10/13/21- Shoulder External Rotation and Scapular Retraction with Resistance  - 1 x daily - 7 x weekly - 2 sets - 15 reps - Standing Shoulder Horizontal Abduction with Resistance  - 1 x daily - 7 x weekly - 2 sets - 15 reps - Standing Shoulder Single Arm PNF D2 Flexion with Resistance  - 1 x daily - 7 x weekly - 2 sets - 10 reps - Standing Shoulder Row with Anchored Resistance  - 1 x daily - 7 x weekly - 2 sets - 10 reps - Shoulder extension with resistance - Neutral  - 1 x daily - 7 x weekly - 2 sets - 15 reps   Date: 09/22/2021 Prepared by: [facility] - Rehab  Exercises - Seated Thoracic Lumbar Extension with Pectoralis Stretch  - 3 x daily - 7 x weekly - 2 sets - 10 reps - 5 second hold - Doorway Pec Stretch at 90 Degrees Abduction  - 3 x daily - 7 x weekly - 5 reps - 20 second hold - Quadruped Thoracic Rotation with Hand on Neck  - 3 x daily - 7 x weekly - 2 sets - 10 reps - 5 second  hold - Sidelying Open Book Thoracic Lumbar Rotation and Extension  - 3 x daily - 7 x weekly - 1 sets - 10 reps - Prone Press Up  - 1 x daily - 7 x weekly - 1 sets - 10 reps - Supine Chest Stretch on Foam Roll  - 1 x daily - 7 x weekly - 1 sets - 10 reps - Supine Thoracic Mobilization Foam Roll Horizontal with Arm Stretch  - 1 x daily - 7 x weekly - 1 sets - 10 reps  Access Code: [email or web address] URL: [email or web address] Date: 09/15/2021 - Seated Thoracic Lumbar Extension with Pectoralis Stretch  - 3 x daily - 7 x weekly - 2 sets - 10 reps - 5 second hold - Doorway Pec Stretch at 90 Degrees Abduction  - 3 x daily - 7 x weekly - 5 reps - 20 second hold - Quadruped Thoracic Rotation with Hand on Neck  - 3 x daily - 7 x weekly - 2 sets - 10 reps - 5 second  hold  ASSESSMENT:  CLINICAL  IMPRESSION: Patient with minimal compliant of back, chest pain today.  Continues with pain right shoulder; tender right AC joint area. Trial of thoracic mobs; patient with noted significant tightness right side paraspinals versus left. Added open book stretch in standing to HEP.  Patient overall continues with forward head and rounded shoulders.  Patient will continue to benefit from physical therapy in order to improve function and reduce impairment.    OBJECTIVE IMPAIRMENTS decreased activity tolerance, decreased endurance, decreased mobility, difficulty walking, decreased ROM, decreased strength, hypomobility, increased muscle spasms, impaired flexibility, impaired UE functional use, improper body mechanics, postural dysfunction, and pain.   ACTIVITY LIMITATIONS carrying, lifting, reach over head, locomotion level, and caring for others  PARTICIPATION LIMITATIONS: cleaning, community activity, occupation, and yard work  PERSONAL  FACTORS Time since onset of injury/illness/exacerbation and 1 comorbidity: hx back pain  are also affecting patient's functional outcome.   REHAB POTENTIAL: Good  CLINICAL DECISION MAKING: Stable/uncomplicated  EVALUATION COMPLEXITY: Low   GOALS: Goals reviewed with patient? Yes  SHORT TERM GOALS: Target date: 10/06/2021  Patient will be independent with HEP in order to improve functional outcomes. Baseline:  Goal status: ongoing  2.  Patient will report at least 25% improvement in symptoms for improved quality of life. Baseline:  Goal status: ongoing   LONG TERM GOALS: Target date: 10/27/2021  Patient will report at least 75% improvement in symptoms for improved quality of life. Baseline:  Goal status: ongoing  2.  Patient will improve FOTO score by at least 7 points in order to indicate improved tolerance to activity. Baseline: 72% function Goal status: ongoing  3.  Patient will demonstrate upright posture for at least 10 minutes without cueing  for improved postural awareness and strength. Baseline: forward head, rounded shoulders, kyphotic Goal status: ongoing  4.  Patient will be able to return to all activities unrestricted for improved ability to perform work functions and participate with family.  Baseline: restricted Goal status: ongoing     PLAN: PT FREQUENCY: 1x/week  PT DURATION: 6 weeks  PLANNED INTERVENTIONS: Therapeutic exercises, Therapeutic activity, Neuromuscular re-education, Balance training, Gait training, Patient/Family education, Joint manipulation, Joint mobilization, Stair training, Orthotic/Fit training, DME instructions, Aquatic Therapy, Dry Needling, Electrical stimulation, Spinal manipulation, Spinal mobilization, Cryotherapy, Moist heat, Compression bandaging, scar mobilization, Splintting, Taping, Traction, Ultrasound, Ionotophoresis 4mg /ml Dexamethasone, and Manual therapy   PLAN FOR NEXT SESSION: f/u with HEP, thoracic mobility, postural strengthening, pec length, possibly thoracic mobs   3:15 PM, 10/20/21 Caycee Wanat Small Lyza Houseworth MPT [facility] [place] [phone] Ph:[phone]

## 2021-10-27 ENCOUNTER — Ambulatory Visit (HOSPITAL_COMMUNITY): Payer: 59 | Admitting: Physical Therapy

## 2021-11-03 ENCOUNTER — Encounter (HOSPITAL_COMMUNITY): Payer: 59

## 2021-11-03 ENCOUNTER — Ambulatory Visit
Admission: EM | Admit: 2021-11-03 | Discharge: 2021-11-03 | Disposition: A | Discharge status: Home / Self Care | Payer: 59 | Attending: Nurse Practitioner | Admitting: Nurse Practitioner

## 2021-11-03 DIAGNOSIS — K219 Gastro-esophageal reflux disease without esophagitis: Secondary | ICD-10-CM

## 2021-11-03 MED ORDER — ALUM & MAG HYDROXIDE-SIMETH 200-200-20 MG/5ML PO SUSP
30.0000 mL | Freq: Once | ORAL | Status: AC
Start: 2021-11-03 — End: 2021-11-03
  Administered 2021-11-03: 30 mL via ORAL

## 2021-11-03 NOTE — ED Triage Notes (Signed)
Pt presents with c/o  abdominal discomfort for past week, states it feels like a nerve moving , has been taking pantoprazole for 3 weeks

## 2021-11-03 NOTE — ED Provider Notes (Signed)
[facility]    CSN: [id number] Arrival date & time: 11/03/21  1604      History   Chief Complaint Chief Complaint  Patient presents with   Abdominal Pain    HPI Adam Yates is a 51 y.o. male.   Patient presents with abdominal pain ongoing for the past 5 days.  Reports the pain comes on after eating and described as a "cold burn.  Reports it is a 5 out of 10.  Reports the pain comes and goes throughout the day, it is constant but ranges in severity.  Denies radiation of pain to his back, other side of the belly, or leg.  He denies fever, nausea/vomiting, unexplained weight loss, decreased appetite, diarrhea or constipation, blood in the stool, rash, dysuria/urinary frequency, hematuria.  He endorses heartburn and frequent NSAID use.  Recently stopped using ibuprofen because it was upsetting his stomach.  He has tried Pepto-Bismol and Tums which have provided temporary relief.  Eating makes the pain worse.  Reports he recently started pantoprazole about 3 weeks ago.    Past Medical History:  Diagnosis Date   High cholesterol     There are no problems to display for this patient.   History reviewed. No pertinent surgical history.     Home Medications    Prior to Admission medications   Medication Sig Start Date End Date Taking? Authorizing Provider  ibuprofen (ADVIL) 800 MG tablet Take 1 tablet (800 mg total) by mouth 3 (three) times daily. 08/08/21   Leath-Warren, Alda Lea, NP  pantoprazole (PROTONIX) 40 MG tablet Take 1 tablet (40 mg total) by mouth daily. 08/08/21   Leath-Warren, Alda Lea, NP    Family History History reviewed. No pertinent family history.  Social History Social History   Tobacco Use   Smoking status: Every Day    Packs/day: 0.50    Types: Cigarettes   Smokeless tobacco: Never  Substance Use Topics   Alcohol use: Never   Drug use: Never     Allergies   Avelox [moxifloxacin]   Review of Systems Review of Systems Per  HPI  Physical Exam Triage Vital Signs ED Triage Vitals  Enc Vitals Group     BP 11/03/21 1621 133/86     Pulse Rate 11/03/21 1621 73     Resp 11/03/21 1621 18     Temp 11/03/21 1621 98.3 F (36.8 C)     Temp src --      SpO2 11/03/21 1621 97 %     Weight --      Height --      Head Circumference --      Peak Flow --      Pain Score 11/03/21 1620 0     Pain Loc --      Pain Edu? --      Excl. in [place]? --    No data found.  Updated Vital Signs BP 133/86   Pulse 73   Temp 98.3 F (36.8 C)   Resp 18   SpO2 97%   Visual Acuity Right Eye Distance:   Left Eye Distance:   Bilateral Distance:    Right Eye Near:   Left Eye Near:    Bilateral Near:     Physical Exam Vitals and nursing note reviewed.  Constitutional:      General: He is not in acute distress.    Appearance: Normal appearance. He is not toxic-appearing.  HENT:     Head: Normocephalic and atraumatic.  Mouth/Throat:     Mouth: Mucous membranes are moist.     Pharynx: Oropharynx is clear. No posterior oropharyngeal erythema.  Cardiovascular:     Rate and Rhythm: Normal rate and regular rhythm.  Pulmonary:     Effort: Pulmonary effort is normal. No respiratory distress.     Breath sounds: Normal breath sounds. No wheezing, rhonchi or rales.  Abdominal:     General: Abdomen is flat. Bowel sounds are normal. There is no distension.     Palpations: Abdomen is soft.     Tenderness: There is abdominal tenderness in the epigastric area. There is no right CVA tenderness, left CVA tenderness, guarding or rebound.     Hernia: No hernia is present.  Musculoskeletal:     Cervical back: Normal range of motion.  Lymphadenopathy:     Cervical: No cervical adenopathy.  Skin:    General: Skin is warm and dry.     Capillary Refill: Capillary refill takes less than 2 seconds.     Coloration: Skin is not jaundiced or pale.     Findings: No erythema.  Neurological:     Mental Status: He is alert.     Motor: No  weakness.     Gait: Gait normal.  Psychiatric:        Behavior: Behavior is cooperative.      UC Treatments / Results  Labs (all labs ordered are listed, but only abnormal results are displayed) Labs Reviewed - No data to display  EKG   Radiology No results found.  Procedures Procedures (including critical care time)  Medications Ordered in UC Medications  alum & mag hydroxide-simeth (MAALOX/MYLANTA) 200-200-20 MG/5ML suspension 30 mL (30 mLs Oral Given 11/03/21 1700)    Initial Impression / Assessment and Plan / UC Course  I have reviewed the triage vital signs and the nursing notes.  Pertinent labs & imaging results that were available during my care of the patient were reviewed by me and considered in my medical decision making (see chart for details).    Patient is well-appearing, normotensive, afebrile, not tachycardic, not tachypneic, oxygenating well on room air.  No red flags in history or on examination.  GI cocktail given with moderate relief of symptoms.  Suspect gastritis.  Recommended use of Tums, Maalox/Mylanta as needed while pantoprazole is getting in his system.  Recommended follow-up with gastroenterologist with no improvement and contact information given.  ER and return precautions discussed.  The patient was given the opportunity to ask questions.  All questions answered to their satisfaction.  The patient is in agreement to this plan.    Final Clinical Impressions(s) / UC Diagnoses   Final diagnoses:  Gastroesophageal reflux disease, unspecified whether esophagitis present     Discharge Instructions      We have given you a dose of Maalox/Mylanta today.  Please pick this up over-the-counter to help with the ongoing abdominal pain.  Continue the pantoprazole and avoid foods high in acid-refer to the handout attached.  Follow-up with primary care provider with no improvement in symptoms with this medicine.     ED Prescriptions   None    PDMP  not reviewed this encounter.   Valentino Nose, NP 11/03/21 [phone]

## 2021-11-03 NOTE — Discharge Instructions (Signed)
We have given you a dose of Maalox/Mylanta today.  Please pick this up over-the-counter to help with the ongoing abdominal pain.  Continue the pantoprazole and avoid foods high in acid-refer to the handout attached.  Follow-up with primary care provider with no improvement in symptoms with this medicine.

## 2021-11-10 ENCOUNTER — Encounter (HOSPITAL_COMMUNITY): Payer: 59

## 2021-11-17 ENCOUNTER — Ambulatory Visit (HOSPITAL_COMMUNITY)
Admission: RE | Admit: 2021-11-17 | Discharge: 2021-11-17 | Disposition: A | Discharge status: Home / Self Care | Payer: 59 | Source: Ambulatory Visit | Attending: Internal Medicine | Admitting: Internal Medicine

## 2021-11-17 DIAGNOSIS — R002 Palpitations: Secondary | ICD-10-CM | POA: Diagnosis not present

## 2021-11-17 DIAGNOSIS — K219 Gastro-esophageal reflux disease without esophagitis: Secondary | ICD-10-CM | POA: Diagnosis not present

## 2021-11-20 ENCOUNTER — Emergency Department (HOSPITAL_COMMUNITY): Payer: 59

## 2021-11-20 ENCOUNTER — Ambulatory Visit
Admission: EM | Admit: 2021-11-20 | Discharge: 2021-11-20 | Payer: 59 | Attending: Nurse Practitioner | Admitting: Nurse Practitioner

## 2021-11-20 ENCOUNTER — Other Ambulatory Visit: Payer: Self-pay

## 2021-11-20 ENCOUNTER — Ambulatory Visit: Payer: 59 | Admitting: Nurse Practitioner

## 2021-11-20 ENCOUNTER — Encounter (HOSPITAL_COMMUNITY): Payer: Self-pay | Admitting: *Deleted

## 2021-11-20 ENCOUNTER — Emergency Department (HOSPITAL_COMMUNITY)
Admission: EM | Admit: 2021-11-20 | Discharge: 2021-11-20 | Disposition: A | Discharge status: Home / Self Care | Payer: 59 | Attending: Emergency Medicine | Admitting: Emergency Medicine

## 2021-11-20 DIAGNOSIS — R0789 Other chest pain: Secondary | ICD-10-CM

## 2021-11-20 DIAGNOSIS — R002 Palpitations: Secondary | ICD-10-CM | POA: Diagnosis not present

## 2021-11-20 DIAGNOSIS — R079 Chest pain, unspecified: Secondary | ICD-10-CM | POA: Insufficient documentation

## 2021-11-20 DIAGNOSIS — D72829 Elevated white blood cell count, unspecified: Secondary | ICD-10-CM | POA: Insufficient documentation

## 2021-11-20 LAB — POCT FASTING CBG KUC MANUAL ENTRY: POCT Glucose (KUC): 112 mg/dL — AB (ref 70–99)

## 2021-11-20 LAB — CBC
HCT: 49.7 % (ref 39.0–52.0)
Hemoglobin: 16.4 g/dL (ref 13.0–17.0)
MCH: 29.7 pg (ref 26.0–34.0)
MCHC: 33 g/dL (ref 30.0–36.0)
MCV: 90 fL (ref 80.0–100.0)
Platelets: 253 10*3/uL (ref 150–400)
RBC: 5.52 MIL/uL (ref 4.22–5.81)
RDW: 12.7 % (ref 11.5–15.5)
WBC: 15 10*3/uL — ABNORMAL HIGH (ref 4.0–10.5)
nRBC: 0 % (ref 0.0–0.2)

## 2021-11-20 LAB — BASIC METABOLIC PANEL
Anion gap: 9 (ref 5–15)
BUN: 12 mg/dL (ref 6–20)
CO2: 24 mmol/L (ref 22–32)
Calcium: 9.3 mg/dL (ref 8.9–10.3)
Chloride: 105 mmol/L (ref 98–111)
Creatinine, Ser: 0.86 mg/dL (ref 0.61–1.24)
GFR, Estimated: >60 mL/min (ref >60)
Glucose, Bld: 101 mg/dL — ABNORMAL HIGH (ref 70–99)
Potassium: 3.6 mmol/L (ref 3.5–5.1)
Sodium: 138 mmol/L (ref 135–145)

## 2021-11-20 LAB — TROPONIN I (HIGH SENSITIVITY)
Troponin I (High Sensitivity): <2 ng/L (ref <18)
Troponin I (High Sensitivity): 2 ng/L (ref <18)

## 2021-11-20 LAB — TSH: TSH: 1.358 u[IU]/mL (ref 0.350–4.500)

## 2021-11-20 NOTE — Discharge Instructions (Signed)
I have placed a referral for cardiology for you.  They should be calling you in the coming days.  If they do not call you in 1 week, please give them a call.  Please return to the emergency room for any worsening symptoms.  As we discussed today, your labs and imaging were all normal.  I would stop taking pantoprazole and schedule an appoint with your primary care doctor to get on another medication.

## 2021-11-20 NOTE — ED Provider Notes (Signed)
[facility] EMERGENCY DEPARTMENT Provider Note   CSN: [id number] Arrival date & time: 11/20/21  1624     History Chief Complaint  Patient presents with   Chest Pain    Adam Yates is a 51 y.o. male with history of hyperlipidemia, gastric ulcers, and reflux who presents to the emergency department for further evaluation of chest pain and palpitations which she describes as a fast heart rate sensation.  This started earlier today and lasted approximately 20 to 30 minutes.  He states that this is related to his pantoprazole use which was prescribed for gastric ulcers.  He states that he has been having the symptoms early in the morning after taking this medication.  He stopped taking the medication yesterday and did not have any morning symptoms this morning but did have this episode earlier today.  He was seen evaluated urgent care and sent to the ED for further evaluation.  He currently denies any chest pain, shortness of breath, abdominal pain, fever, chills, nausea, vomiting, diaphoresis.  Chest Pain      Home Medications Prior to Admission medications   Medication Sig Start Date End Date Taking? Authorizing Provider  albuterol (VENTOLIN HFA) 108 (90 Base) MCG/ACT inhaler Inhale 1 puff into the lungs every 4 (four) hours as needed for shortness of breath.   Yes [provider]  atorvastatin (LIPITOR) 20 MG tablet Take 20 mg by mouth daily. 11/07/21  Yes [provider]  fluticasone (FLONASE) 50 MCG/ACT nasal spray 1 spray each nostril every day 05/01/19  Yes [provider]  pantoprazole (PROTONIX) 40 MG tablet Take 1 tablet (40 mg total) by mouth daily. 08/08/21  Yes Leath-Warren, Sadie Haber, NP  ibuprofen (ADVIL) 800 MG tablet Take 1 tablet (800 mg total) by mouth 3 (three) times daily. Patient not taking: Reported on 11/20/2021 08/08/21   Leath-Warren, Sadie Haber, NP  montelukast (SINGULAIR) 10 MG tablet Take 10 mg by mouth at bedtime. Patient not  taking: Reported on 11/20/2021 07/09/21   [provider]      Allergies    Amoxicillin, Moxifloxacin, and Pantoprazole    Review of Systems   Review of Systems  Cardiovascular:  Positive for chest pain.  All other systems reviewed and are negative.   Physical Exam Updated Vital Signs BP (!) 134/91   Pulse 66   Temp 97.7 F (36.5 C) (Oral)   Resp 18   Ht 5\' 7"  (1.702 m)   Wt 81.6 kg   SpO2 97%   BMI 28.19 kg/m  Physical Exam Vitals and nursing note reviewed.  Constitutional:      General: He is not in acute distress.    Appearance: Normal appearance.  HENT:     Head: Normocephalic and atraumatic.  Eyes:     General:        Right eye: No discharge.        Left eye: No discharge.  Cardiovascular:     Comments: Regular rate and rhythm.  S1/S2 are distinct without any evidence of murmur, rubs, or gallops.  Radial pulses are 2+ bilaterally.  Dorsalis pedis pulses are 2+ bilaterally.  No evidence of pedal edema. Pulmonary:     Comments: Clear to auscultation bilaterally.  Normal effort.  No respiratory distress.  No evidence of wheezes, rales, or rhonchi heard throughout. Abdominal:     General: Abdomen is flat. Bowel sounds are normal. There is no distension.     Tenderness: There is no abdominal tenderness. There is no  guarding or rebound.  Musculoskeletal:        General: Normal range of motion.     Cervical back: Neck supple.  Skin:    General: Skin is warm and dry.     Findings: No rash.  Neurological:     General: No focal deficit present.     Mental Status: He is alert.  Psychiatric:        Mood and Affect: Mood normal.        Behavior: Behavior normal.     ED Results / Procedures / Treatments   Labs (all labs ordered are listed, but only abnormal results are displayed) Labs Reviewed  BASIC METABOLIC PANEL - Abnormal; Notable for the following components:      Result Value   Glucose, Bld 101 (*)    All other components within normal limits  CBC  - Abnormal; Notable for the following components:   WBC 15.0 (*)    All other components within normal limits  TSH  TROPONIN I (HIGH SENSITIVITY)  TROPONIN I (HIGH SENSITIVITY)    EKG None  Radiology DG Chest 2 View  Result Date: 11/20/2021 CLINICAL DATA:  Chest pain EXAM: CHEST - 2 VIEW COMPARISON:  Chest 06/09/2021 FINDINGS: The heart size and mediastinal contours are within normal limits. Both lungs are clear. The visualized skeletal structures are unremarkable. IMPRESSION: No active cardiopulmonary disease. Electronically Signed   By: Franchot Gallo M.D.   On: 11/20/2021 17:05    Procedures Procedures    Medications Ordered in ED Medications - No data to display  ED Course/ Medical Decision Making/ A&P Clinical Course as of 11/20/21 2038  Thu Nov 20, 2021  2034 CBC(!) There is evidence of leukocytosis otherwise no abnormalities. [CF]  5400 Basic metabolic panel(!) Normal. [CF]  2034 Troponin I (High Sensitivity) Initial and delta troponin are normal. [CF]  2035 TSH Normal.  [CF]  2035 DG Chest 2 View I personally ordered and interpreted the study and do not see any evidence of pneumonia or pneumothorax.  I do agree with radiologist interpretation. [CF]    Clinical Course User Index [CF] Hendricks Limes, PA-C                           Medical Decision Making Adam Yates is a 51 y.o. male patient who presents to the emergency department today for further evaluation of chest pain.  Given that he has been having the symptoms although likely medication related based on history I will get cardiac enzymes, EKG, and chest x-ray.  Patient is asymptomatic.  We will hold off on medications at this time. He is not acute distress with normal vital signs.   Patient remains asymptomatic.  Labs and imaging are all normal here.  EKG is reassuring.  I will have him follow-up with cardiology for further evaluation.  I will also have him stop taking his pantoprazole as this might  be causing his symptoms.  He will follow-up with your primary care doctor to get another medication.  All questions or concerns addressed.  Strict return precautions were discussed.  He is safe for discharge.  Amount and/or Complexity of Data Reviewed Labs: ordered. Decision-making details documented in ED Course. Radiology: ordered. Decision-making details documented in ED Course.   Final Clinical Impression(s) / ED Diagnoses Final diagnoses:  Chest pain, unspecified type  Palpitations    Rx / DC Orders ED Discharge Orders  Ordered    Ambulatory referral to Cardiology        11/20/21 1942              Jolyn Lent 11/20/21 2038    Glyn Ade, MD 11/20/21 2115

## 2021-11-20 NOTE — ED Triage Notes (Signed)
Pt with c/o left chest pain that started today while driving.  Had some dizziness before CP started.  Denies SOB or N/V

## 2021-11-20 NOTE — ED Notes (Signed)
Patient is being discharged from the Urgent Care and sent to the Emergency Department via private vehicle . Per NP, patient is in need of higher level of care due to chest pain. Patient is aware and verbalizes understanding of plan of care.  Vitals:   11/20/21 1532  BP: (!) 151/95  Pulse: 85  Resp: 18  Temp: 97.9 F (36.6 C)  SpO2: 98%

## 2021-11-20 NOTE — ED Triage Notes (Signed)
Pt reports lightheadedness heart rate was 130 1 hr ago when he checked his watch. Pt reports he started having this symptoms after he started taking pantoprazole x 3 weeks, he stopped last night as he read all the side effects. Denies chest pain, vision changes, nausea, vomiting.   States he is feeling thirsty, and he had water 5 min ago.

## 2021-11-20 NOTE — ED Provider Notes (Signed)
[facility]    CSN: [id number] Arrival date & time: 11/20/21  1520      History   Chief Complaint No chief complaint on file.   HPI Adam Yates is a 51 y.o. male.   Patient presents for elevated heart rate and chest pressure.  Reports last night, he read the side effects of pantoprazole and got worried so he stopped taking it.  Reports he was driving in his car today and felt the elevated heart rate, felt dizzy, and jittery, and checked his Apple Watch that showed a heart rate in the 130s.  Denies any history of elevated heart rate.  Does not take any blood pressure medications or medications for his heart per his report.    Past Medical History:  Diagnosis Date   High cholesterol     There are no problems to display for this patient.   History reviewed. No pertinent surgical history.     Home Medications    Prior to Admission medications   Medication Sig Start Date End Date Taking? Authorizing Provider  atorvastatin (LIPITOR) 20 MG tablet Take 20 mg by mouth daily. 11/07/21   [provider]  ibuprofen (ADVIL) 800 MG tablet Take 1 tablet (800 mg total) by mouth 3 (three) times daily. 08/08/21   Leath-Warren, Sadie Haber, NP  pantoprazole (PROTONIX) 40 MG tablet Take 1 tablet (40 mg total) by mouth daily. 08/08/21   Leath-Warren, Sadie Haber, NP    Family History History reviewed. No pertinent family history.  Social History Social History   Tobacco Use   Smoking status: Every Day    Packs/day: 0.50    Types: Cigarettes   Smokeless tobacco: Never  Substance Use Topics   Alcohol use: Never   Drug use: Never     Allergies   Moxifloxacin   Review of Systems Review of Systems Per HPI  Physical Exam Triage Vital Signs ED Triage Vitals  Enc Vitals Group     BP 11/20/21 1532 (!) 151/95     Pulse Rate 11/20/21 1532 85     Resp 11/20/21 1532 18     Temp 11/20/21 1532 97.9 F (36.6 C)     Temp Source 11/20/21 1532 Oral     SpO2  11/20/21 1532 98 %     Weight --      Height --      Head Circumference --      Peak Flow --      Pain Score 11/20/21 1527 0     Pain Loc --      Pain Edu? --      Excl. in GC? --    No data found.  Updated Vital Signs BP (!) 151/95 (BP Location: Right Arm)   Pulse 85   Temp 97.9 F (36.6 C) (Oral)   Resp 18   SpO2 98%   Visual Acuity Right Eye Distance:   Left Eye Distance:   Bilateral Distance:    Right Eye Near:   Left Eye Near:    Bilateral Near:     Physical Exam Vitals and nursing note reviewed.  Constitutional:      General: He is not in acute distress.    Appearance: Normal appearance. He is not toxic-appearing.  Cardiovascular:     Rate and Rhythm: Normal rate and regular rhythm.     Heart sounds: Normal heart sounds. No murmur heard. Pulmonary:     Effort: Pulmonary effort is normal. No respiratory distress.  Breath sounds: Normal breath sounds. No wheezing, rhonchi or rales.  Skin:    General: Skin is warm and dry.     Capillary Refill: Capillary refill takes less than 2 seconds.     Coloration: Skin is not jaundiced or pale.     Findings: No erythema.  Neurological:     Mental Status: He is alert and oriented to person, place, and time.      UC Treatments / Results  Labs (all labs ordered are listed, but only abnormal results are displayed) Labs Reviewed  POCT FASTING CBG [facility] MANUAL ENTRY - Abnormal; Notable for the following components:      Result Value   POCT Glucose ([facility]) 112 (*)    All other components within normal limits    EKG   Radiology No results found.  Procedures Procedures (including critical care time)  Medications Ordered in UC Medications - No data to display  Initial Impression / Assessment and Plan / UC Course  I have reviewed the triage vital signs and the nursing notes.  Pertinent labs & imaging results that were available during my care of the patient were reviewed by me and considered in my medical  decision making (see chart for details).   Patient is well-appearing, normotensive, afebrile, not tachycardic, not tachypneic, oxygenating well on room air.    Chest pressure EKG is unremarkable CBG is normal Given chest pressure, dizziness, perceived elevated heart rate, recommended further evaluation and work-up in the emergency room Patient in agreement to this plan Patient declines EMS and is stable for transport via private vehicle at this time.  The patient was given the opportunity to ask questions.  All questions answered to their satisfaction.  The patient is in agreement to this plan.    Final Clinical Impressions(s) / UC Diagnoses   Final diagnoses:  Chest pressure     Discharge Instructions      Please go directly to the ER for further evaluation of your chest pressure    ED Prescriptions   None    PDMP not reviewed this encounter.   Eulogio Bear, NP 11/20/21 [phone]

## 2021-11-20 NOTE — Discharge Instructions (Signed)
Please go directly to the ER for further evaluation of your chest pressure

## 2021-11-20 NOTE — ED Provider Triage Note (Signed)
Emergency Medicine Provider Triage Evaluation Note  Adam Yates , a 51 y.o. male  was evaluated in triage.  Pt complains of palpitations.  Patient states that symptoms been present for the past week or so.  Patient reports episode today occurred when he was walking into his car.  He noticed palpitations as well as feelings of dizziness.  He reports symptom onset around the time he began pantoprazole as concerned about a potential medication side effect.  Denies history of cardiac or pulmonary complications.  Denies feelings of shortness of breath or overt chest pain, fever, chills, night sweats, cough, congestion, abdominal pain, nausea, vomiting.  Review of Systems  Positive: See above Negative:   Physical Exam  BP 139/88 (BP Location: Left Arm)   Pulse 76   Temp 97.7 F (36.5 C) (Oral)   Resp 19   Ht 5\' 7"  (1.702 m)   Wt 81.6 kg   SpO2 99%   BMI 28.19 kg/m  Gen:   Awake, no distress   Resp:  Normal effort  MSK:   Moves extremities without difficulty  Other:  Normal rate and rhythm with no obvious murmurs gallops or rubs.  No lower extremity edema noted.  Medical Decision Making  Medically screening exam initiated at 6:32 PM.  Appropriate orders placed.  [name] was informed that the remainder of the evaluation will be completed by another provider, this initial triage assessment does not replace that evaluation, and the importance of remaining in the ED until their evaluation is complete.     Wilnette Kales, [place] 11/20/21 [phone]

## 2021-12-01 DIAGNOSIS — M546 Pain in thoracic spine: Secondary | ICD-10-CM | POA: Diagnosis not present

## 2021-12-04 ENCOUNTER — Ambulatory Visit: Payer: 59 | Attending: Internal Medicine | Admitting: Internal Medicine

## 2021-12-04 ENCOUNTER — Encounter: Payer: Self-pay | Admitting: Internal Medicine

## 2021-12-04 VITALS — BP 120/82 | HR 87 | Ht 67.0 in | Wt 178.6 lb

## 2021-12-04 DIAGNOSIS — Z720 Tobacco use: Secondary | ICD-10-CM | POA: Diagnosis not present

## 2021-12-04 DIAGNOSIS — Z888 Allergy status to other drugs, medicaments and biological substances status: Secondary | ICD-10-CM | POA: Diagnosis not present

## 2021-12-04 DIAGNOSIS — R079 Chest pain, unspecified: Secondary | ICD-10-CM

## 2021-12-04 DIAGNOSIS — E785 Hyperlipidemia, unspecified: Secondary | ICD-10-CM | POA: Diagnosis not present

## 2021-12-04 NOTE — Patient Instructions (Addendum)
Medication Instructions:   Your physician recommends that you continue on your current medications as directed. Please refer to the Current Medication list given to you today.  Labwork:  none  Testing/Procedures:  none  Follow-Up:  Your physician recommends that you schedule a follow-up appointment in: as needed.  Any Other Special Instructions Will Be Listed Below (If Applicable).  If you need a refill on your cardiac medications before your next appointment, please call your pharmacy. 

## 2021-12-04 NOTE — Progress Notes (Signed)
Cardiology Office Note  Date: 12/04/2021   ID: Adam Yates, DOB Dec 27, 1970, MRN [id number]  PCP:  [name], MD  Cardiologist:  None Electrophysiologist:  None   Reason for Office Visit: Evaluation of chest pain at the request of Meredeth Ide, [place]   History of Present Illness: Adam Yates is a 51 y.o. 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  Patient stated that he was prescribed pantoprazole for his gastric ulcers however he started to experience daily palpitations associated with chest pains as well as muscle cramps soon after taking it. He went to the urgent care 2 weeks ago to get evaluated. His EKG, chest x-ray and labs were within normal limits. Pantoprazole was discontinued and he was discharged with follow-up ointment to cardiology clinic. He did not have any more palpitations/muscle cramps/chest pains after discontinuing pantoprazole.  Patient is physically active at baseline, he walks on treadmill for 30 minutes daily and denied any angina during that time. Denied any DOE, lightheadedness, dizziness, syncope. He is compliant with his medications and no side effects. He smokes cigarettes 5/day (cut down from half a pack a day), denied alcohol use or illicit drug abuse.   Past Medical History:  Diagnosis Date   High cholesterol     No past surgical history on file.  Current Outpatient Medications  Medication Sig Dispense Refill   albuterol (VENTOLIN HFA) 108 (90 Base) MCG/ACT inhaler Inhale 1 puff into the lungs every 4 (four) hours as needed for shortness of breath.     atorvastatin (LIPITOR) 20 MG tablet Take 20 mg by mouth daily.     fluticasone (FLONASE) 50 MCG/ACT nasal spray 1 spray each nostril every day     gabapentin (NEURONTIN) 300 MG capsule SMARTSIG:1 Capsule(s) By Mouth Every Evening PRN (Patient not taking: Reported on 12/04/2021)      pantoprazole (PROTONIX) 40 MG tablet Take 1 tablet (40 mg total) by mouth daily. (Patient not taking: Reported on 12/04/2021) 30 tablet 0   predniSONE (STERAPRED UNI-PAK 21 TAB) 5 MG (21) TBPK tablet Take by mouth. (Patient not taking: Reported on 12/04/2021)     No current facility-administered medications for this visit.   Allergies:  Amoxicillin, Moxifloxacin, and Pantoprazole   Social History: The patient  reports that he has been smoking cigarettes. He has been smoking an average of .5 packs per day. He has never used smokeless tobacco. He reports that he does not drink alcohol and does not use drugs.   Family History: The patient's family history is not on file.   ROS:  Please see the history of present illness. Otherwise, complete review of systems is positive for none.  All other systems are reviewed and negative.   Physical Exam: VS:  Ht 5\' 7"  (1.702 m)   Wt 178 lb 9.6 oz (81 kg)   BMI 27.97 kg/m , BMI Body mass index is 27.97 kg/m.  Wt Readings from Last 3 Encounters:  12/04/21 178 lb 9.6 oz (81 kg)  11/20/21 180 lb (81.6 kg)    General: Patient appears comfortable at rest. HEENT: Conjunctiva and lids normal, oropharynx clear with moist mucosa. Neck: Supple, no elevated JVP or carotid bruits, no thyromegaly. Lungs: Clear to auscultation, nonlabored breathing at rest. Cardiac: Regular rate and rhythm, no S3 or significant systolic murmur, no pericardial rub. Abdomen: Soft, nontender, no hepatomegaly, bowel sounds present, no guarding or  rebound. Extremities: No pitting edema, distal pulses 2+. Skin: Warm and dry. Musculoskeletal: No kyphosis. Neuropsychiatric: Alert and oriented x3, affect grossly appropriate.  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  #Palpitations likely secondary to pantoprazole side effect Plan -Patient started to experience muscle cramps, palpitations associated with chest pain soon after taking pantoprazole. Symptoms might be secondary to likely hypomagnesemia. I do not have any magnesium levels from his prior labs to confirm. Continue to hold pantoprazole or any other PPIs. He will likely need scheduled electrolyte repletion if PPI is deemed necessary at some point in the future.  #Chest pain associated with palpitations Plan -Patient is physically active at baseline (walks on treadmill for 30 minutes daily) and denied any angina during exertion. He denied any more chest pains after discontinuing Pantoprazole. I do not suspect any cardiac chest pain at this point. No further cardiac noninvasive testing is indicated.  #HLD goal less than 100 Plan -Continue atorvastatin 20 mg nightly.  Obtain lipid panel with his PCP.  #Nicotine abuse Plan -Patient currently smoking 5 cigarettes/day which she had already cut down from half a pack a day. Congratulated him and encouraged him to continue cutting down cigarettes and eventually quit.  I have spent a total of 45 minutes with patient reviewing chart, EKGs, labs and examining patient as well as establishing an assessment and plan that was discussed with the patient.  > 50% of time was spent in direct patient care.      Medication Adjustments/Labs and Tests Ordered: Current medicines are reviewed at length with the patient today.  Concerns regarding medicines are outlined above.   Tests Ordered: No orders of the defined types  were placed in this encounter.   Medication Changes: No orders of the defined types were placed in this encounter.   Disposition:  Follow up PRN  Signed Vangie Bicker, MD, 12/04/2021 8:32 AM    [facility] [place], [place], [place] [address]

## 2021-12-05 DIAGNOSIS — M9902 Segmental and somatic dysfunction of thoracic region: Secondary | ICD-10-CM | POA: Diagnosis not present

## 2021-12-05 DIAGNOSIS — M9905 Segmental and somatic dysfunction of pelvic region: Secondary | ICD-10-CM | POA: Diagnosis not present

## 2021-12-05 DIAGNOSIS — M546 Pain in thoracic spine: Secondary | ICD-10-CM | POA: Diagnosis not present

## 2021-12-05 DIAGNOSIS — M5414 Radiculopathy, thoracic region: Secondary | ICD-10-CM | POA: Diagnosis not present

## 2021-12-05 DIAGNOSIS — M9903 Segmental and somatic dysfunction of lumbar region: Secondary | ICD-10-CM | POA: Diagnosis not present

## 2021-12-13 DIAGNOSIS — M5414 Radiculopathy, thoracic region: Secondary | ICD-10-CM | POA: Diagnosis not present

## 2021-12-31 DIAGNOSIS — M4724 Other spondylosis with radiculopathy, thoracic region: Secondary | ICD-10-CM | POA: Diagnosis not present

## 2022-01-12 DIAGNOSIS — J42 Unspecified chronic bronchitis: Secondary | ICD-10-CM | POA: Diagnosis not present

## 2022-01-12 DIAGNOSIS — M5124 Other intervertebral disc displacement, thoracic region: Secondary | ICD-10-CM | POA: Diagnosis not present

## 2022-01-12 DIAGNOSIS — J3089 Other allergic rhinitis: Secondary | ICD-10-CM | POA: Diagnosis not present

## 2022-01-12 DIAGNOSIS — E785 Hyperlipidemia, unspecified: Secondary | ICD-10-CM | POA: Diagnosis not present

## 2022-05-18 DIAGNOSIS — F17210 Nicotine dependence, cigarettes, uncomplicated: Secondary | ICD-10-CM | POA: Diagnosis not present

## 2022-05-18 DIAGNOSIS — E785 Hyperlipidemia, unspecified: Secondary | ICD-10-CM | POA: Diagnosis not present

## 2022-05-18 DIAGNOSIS — Z5181 Encounter for therapeutic drug level monitoring: Secondary | ICD-10-CM | POA: Diagnosis not present

## 2022-05-18 DIAGNOSIS — R030 Elevated blood-pressure reading, without diagnosis of hypertension: Secondary | ICD-10-CM | POA: Diagnosis not present

## 2022-05-18 DIAGNOSIS — J302 Other seasonal allergic rhinitis: Secondary | ICD-10-CM | POA: Diagnosis not present

## 2022-05-25 DIAGNOSIS — M546 Pain in thoracic spine: Secondary | ICD-10-CM | POA: Diagnosis not present

## 2022-06-04 DIAGNOSIS — M5414 Radiculopathy, thoracic region: Secondary | ICD-10-CM | POA: Diagnosis not present

## 2022-07-01 DIAGNOSIS — M546 Pain in thoracic spine: Secondary | ICD-10-CM | POA: Diagnosis not present

## 2022-07-01 DIAGNOSIS — M9902 Segmental and somatic dysfunction of thoracic region: Secondary | ICD-10-CM | POA: Diagnosis not present

## 2022-07-01 DIAGNOSIS — M9905 Segmental and somatic dysfunction of pelvic region: Secondary | ICD-10-CM | POA: Diagnosis not present

## 2022-07-01 DIAGNOSIS — M9903 Segmental and somatic dysfunction of lumbar region: Secondary | ICD-10-CM | POA: Diagnosis not present

## 2022-07-01 DIAGNOSIS — M62830 Muscle spasm of back: Secondary | ICD-10-CM | POA: Diagnosis not present

## 2022-07-06 DIAGNOSIS — M9905 Segmental and somatic dysfunction of pelvic region: Secondary | ICD-10-CM | POA: Diagnosis not present

## 2022-07-06 DIAGNOSIS — M546 Pain in thoracic spine: Secondary | ICD-10-CM | POA: Diagnosis not present

## 2022-07-06 DIAGNOSIS — M9903 Segmental and somatic dysfunction of lumbar region: Secondary | ICD-10-CM | POA: Diagnosis not present

## 2022-07-06 DIAGNOSIS — M62830 Muscle spasm of back: Secondary | ICD-10-CM | POA: Diagnosis not present

## 2022-07-06 DIAGNOSIS — M9902 Segmental and somatic dysfunction of thoracic region: Secondary | ICD-10-CM | POA: Diagnosis not present

## 2022-07-13 DIAGNOSIS — M9905 Segmental and somatic dysfunction of pelvic region: Secondary | ICD-10-CM | POA: Diagnosis not present

## 2022-07-13 DIAGNOSIS — M9902 Segmental and somatic dysfunction of thoracic region: Secondary | ICD-10-CM | POA: Diagnosis not present

## 2022-07-13 DIAGNOSIS — M62830 Muscle spasm of back: Secondary | ICD-10-CM | POA: Diagnosis not present

## 2022-07-13 DIAGNOSIS — M546 Pain in thoracic spine: Secondary | ICD-10-CM | POA: Diagnosis not present

## 2022-07-13 DIAGNOSIS — M9903 Segmental and somatic dysfunction of lumbar region: Secondary | ICD-10-CM | POA: Diagnosis not present

## 2022-07-20 DIAGNOSIS — M546 Pain in thoracic spine: Secondary | ICD-10-CM | POA: Diagnosis not present

## 2022-07-20 DIAGNOSIS — M9905 Segmental and somatic dysfunction of pelvic region: Secondary | ICD-10-CM | POA: Diagnosis not present

## 2022-07-20 DIAGNOSIS — M62830 Muscle spasm of back: Secondary | ICD-10-CM | POA: Diagnosis not present

## 2022-07-20 DIAGNOSIS — M9902 Segmental and somatic dysfunction of thoracic region: Secondary | ICD-10-CM | POA: Diagnosis not present

## 2022-07-20 DIAGNOSIS — M9903 Segmental and somatic dysfunction of lumbar region: Secondary | ICD-10-CM | POA: Diagnosis not present

## 2022-07-27 DIAGNOSIS — M9902 Segmental and somatic dysfunction of thoracic region: Secondary | ICD-10-CM | POA: Diagnosis not present

## 2022-07-27 DIAGNOSIS — M9905 Segmental and somatic dysfunction of pelvic region: Secondary | ICD-10-CM | POA: Diagnosis not present

## 2022-07-27 DIAGNOSIS — M62830 Muscle spasm of back: Secondary | ICD-10-CM | POA: Diagnosis not present

## 2022-07-27 DIAGNOSIS — M546 Pain in thoracic spine: Secondary | ICD-10-CM | POA: Diagnosis not present

## 2022-07-27 DIAGNOSIS — M9903 Segmental and somatic dysfunction of lumbar region: Secondary | ICD-10-CM | POA: Diagnosis not present

## 2022-08-03 DIAGNOSIS — F17210 Nicotine dependence, cigarettes, uncomplicated: Secondary | ICD-10-CM | POA: Diagnosis not present

## 2022-08-03 DIAGNOSIS — Z5181 Encounter for therapeutic drug level monitoring: Secondary | ICD-10-CM | POA: Diagnosis not present

## 2022-08-03 DIAGNOSIS — R030 Elevated blood-pressure reading, without diagnosis of hypertension: Secondary | ICD-10-CM | POA: Diagnosis not present

## 2022-08-03 DIAGNOSIS — M9903 Segmental and somatic dysfunction of lumbar region: Secondary | ICD-10-CM | POA: Diagnosis not present

## 2022-08-03 DIAGNOSIS — M546 Pain in thoracic spine: Secondary | ICD-10-CM | POA: Diagnosis not present

## 2022-08-03 DIAGNOSIS — M9905 Segmental and somatic dysfunction of pelvic region: Secondary | ICD-10-CM | POA: Diagnosis not present

## 2022-08-03 DIAGNOSIS — M62830 Muscle spasm of back: Secondary | ICD-10-CM | POA: Diagnosis not present

## 2022-08-03 DIAGNOSIS — M9902 Segmental and somatic dysfunction of thoracic region: Secondary | ICD-10-CM | POA: Diagnosis not present

## 2022-08-10 DIAGNOSIS — M546 Pain in thoracic spine: Secondary | ICD-10-CM | POA: Diagnosis not present

## 2022-08-10 DIAGNOSIS — M62830 Muscle spasm of back: Secondary | ICD-10-CM | POA: Diagnosis not present

## 2022-08-10 DIAGNOSIS — M9903 Segmental and somatic dysfunction of lumbar region: Secondary | ICD-10-CM | POA: Diagnosis not present

## 2022-08-10 DIAGNOSIS — M9905 Segmental and somatic dysfunction of pelvic region: Secondary | ICD-10-CM | POA: Diagnosis not present

## 2022-08-10 DIAGNOSIS — M9902 Segmental and somatic dysfunction of thoracic region: Secondary | ICD-10-CM | POA: Diagnosis not present

## 2022-08-17 DIAGNOSIS — M62830 Muscle spasm of back: Secondary | ICD-10-CM | POA: Diagnosis not present

## 2022-08-17 DIAGNOSIS — M9902 Segmental and somatic dysfunction of thoracic region: Secondary | ICD-10-CM | POA: Diagnosis not present

## 2022-08-17 DIAGNOSIS — M546 Pain in thoracic spine: Secondary | ICD-10-CM | POA: Diagnosis not present

## 2022-08-17 DIAGNOSIS — M9905 Segmental and somatic dysfunction of pelvic region: Secondary | ICD-10-CM | POA: Diagnosis not present

## 2022-08-17 DIAGNOSIS — M9903 Segmental and somatic dysfunction of lumbar region: Secondary | ICD-10-CM | POA: Diagnosis not present

## 2022-08-24 DIAGNOSIS — M546 Pain in thoracic spine: Secondary | ICD-10-CM | POA: Diagnosis not present

## 2022-08-24 DIAGNOSIS — M9903 Segmental and somatic dysfunction of lumbar region: Secondary | ICD-10-CM | POA: Diagnosis not present

## 2022-08-24 DIAGNOSIS — M9902 Segmental and somatic dysfunction of thoracic region: Secondary | ICD-10-CM | POA: Diagnosis not present

## 2022-08-24 DIAGNOSIS — M62830 Muscle spasm of back: Secondary | ICD-10-CM | POA: Diagnosis not present

## 2022-08-24 DIAGNOSIS — M9905 Segmental and somatic dysfunction of pelvic region: Secondary | ICD-10-CM | POA: Diagnosis not present

## 2022-08-31 DIAGNOSIS — M546 Pain in thoracic spine: Secondary | ICD-10-CM | POA: Diagnosis not present

## 2022-08-31 DIAGNOSIS — M62830 Muscle spasm of back: Secondary | ICD-10-CM | POA: Diagnosis not present

## 2022-08-31 DIAGNOSIS — M9902 Segmental and somatic dysfunction of thoracic region: Secondary | ICD-10-CM | POA: Diagnosis not present

## 2022-08-31 DIAGNOSIS — M9905 Segmental and somatic dysfunction of pelvic region: Secondary | ICD-10-CM | POA: Diagnosis not present

## 2022-08-31 DIAGNOSIS — M9903 Segmental and somatic dysfunction of lumbar region: Secondary | ICD-10-CM | POA: Diagnosis not present

## 2022-09-03 DIAGNOSIS — R1012 Left upper quadrant pain: Secondary | ICD-10-CM | POA: Diagnosis not present

## 2022-09-03 DIAGNOSIS — M546 Pain in thoracic spine: Secondary | ICD-10-CM | POA: Diagnosis not present

## 2022-09-03 DIAGNOSIS — G8929 Other chronic pain: Secondary | ICD-10-CM | POA: Diagnosis not present

## 2022-09-07 DIAGNOSIS — M9903 Segmental and somatic dysfunction of lumbar region: Secondary | ICD-10-CM | POA: Diagnosis not present

## 2022-09-07 DIAGNOSIS — M546 Pain in thoracic spine: Secondary | ICD-10-CM | POA: Diagnosis not present

## 2022-09-07 DIAGNOSIS — M9905 Segmental and somatic dysfunction of pelvic region: Secondary | ICD-10-CM | POA: Diagnosis not present

## 2022-09-07 DIAGNOSIS — M9902 Segmental and somatic dysfunction of thoracic region: Secondary | ICD-10-CM | POA: Diagnosis not present

## 2022-09-07 DIAGNOSIS — M62830 Muscle spasm of back: Secondary | ICD-10-CM | POA: Diagnosis not present

## 2022-09-21 DIAGNOSIS — M62830 Muscle spasm of back: Secondary | ICD-10-CM | POA: Diagnosis not present

## 2022-09-21 DIAGNOSIS — M9905 Segmental and somatic dysfunction of pelvic region: Secondary | ICD-10-CM | POA: Diagnosis not present

## 2022-09-21 DIAGNOSIS — M9902 Segmental and somatic dysfunction of thoracic region: Secondary | ICD-10-CM | POA: Diagnosis not present

## 2022-09-21 DIAGNOSIS — M9903 Segmental and somatic dysfunction of lumbar region: Secondary | ICD-10-CM | POA: Diagnosis not present

## 2022-09-21 DIAGNOSIS — M546 Pain in thoracic spine: Secondary | ICD-10-CM | POA: Diagnosis not present

## 2022-09-28 DIAGNOSIS — M9905 Segmental and somatic dysfunction of pelvic region: Secondary | ICD-10-CM | POA: Diagnosis not present

## 2022-09-28 DIAGNOSIS — M62830 Muscle spasm of back: Secondary | ICD-10-CM | POA: Diagnosis not present

## 2022-09-28 DIAGNOSIS — M546 Pain in thoracic spine: Secondary | ICD-10-CM | POA: Diagnosis not present

## 2022-09-28 DIAGNOSIS — M9902 Segmental and somatic dysfunction of thoracic region: Secondary | ICD-10-CM | POA: Diagnosis not present

## 2022-09-28 DIAGNOSIS — M9903 Segmental and somatic dysfunction of lumbar region: Secondary | ICD-10-CM | POA: Diagnosis not present

## 2022-10-14 DIAGNOSIS — M9903 Segmental and somatic dysfunction of lumbar region: Secondary | ICD-10-CM | POA: Diagnosis not present

## 2022-10-14 DIAGNOSIS — M546 Pain in thoracic spine: Secondary | ICD-10-CM | POA: Diagnosis not present

## 2022-10-14 DIAGNOSIS — M9902 Segmental and somatic dysfunction of thoracic region: Secondary | ICD-10-CM | POA: Diagnosis not present

## 2022-10-14 DIAGNOSIS — M62830 Muscle spasm of back: Secondary | ICD-10-CM | POA: Diagnosis not present

## 2022-10-14 DIAGNOSIS — M9905 Segmental and somatic dysfunction of pelvic region: Secondary | ICD-10-CM | POA: Diagnosis not present

## 2023-03-22 DIAGNOSIS — Z0000 Encounter for general adult medical examination without abnormal findings: Secondary | ICD-10-CM | POA: Diagnosis not present

## 2023-03-22 DIAGNOSIS — Z5181 Encounter for therapeutic drug level monitoring: Secondary | ICD-10-CM | POA: Diagnosis not present

## 2023-03-22 DIAGNOSIS — Z125 Encounter for screening for malignant neoplasm of prostate: Secondary | ICD-10-CM | POA: Diagnosis not present

## 2023-03-22 DIAGNOSIS — J302 Other seasonal allergic rhinitis: Secondary | ICD-10-CM | POA: Diagnosis not present

## 2023-03-22 DIAGNOSIS — E785 Hyperlipidemia, unspecified: Secondary | ICD-10-CM | POA: Diagnosis not present

## 2023-03-22 DIAGNOSIS — Z1211 Encounter for screening for malignant neoplasm of colon: Secondary | ICD-10-CM | POA: Diagnosis not present

## 2023-03-22 DIAGNOSIS — R051 Acute cough: Secondary | ICD-10-CM | POA: Diagnosis not present

## 2023-03-22 DIAGNOSIS — K219 Gastro-esophageal reflux disease without esophagitis: Secondary | ICD-10-CM | POA: Diagnosis not present

## 2023-03-22 DIAGNOSIS — Z1159 Encounter for screening for other viral diseases: Secondary | ICD-10-CM | POA: Diagnosis not present

## 2023-03-22 DIAGNOSIS — Z87891 Personal history of nicotine dependence: Secondary | ICD-10-CM | POA: Diagnosis not present

## 2023-04-27 DIAGNOSIS — Z1211 Encounter for screening for malignant neoplasm of colon: Secondary | ICD-10-CM | POA: Diagnosis not present

## 2023-04-27 DIAGNOSIS — K219 Gastro-esophageal reflux disease without esophagitis: Secondary | ICD-10-CM | POA: Diagnosis not present

## 2023-05-12 DIAGNOSIS — M7712 Lateral epicondylitis, left elbow: Secondary | ICD-10-CM | POA: Diagnosis not present

## 2023-07-23 DIAGNOSIS — I10 Essential (primary) hypertension: Secondary | ICD-10-CM | POA: Diagnosis not present

## 2023-09-01 DIAGNOSIS — Z5181 Encounter for therapeutic drug level monitoring: Secondary | ICD-10-CM | POA: Diagnosis not present

## 2023-09-01 DIAGNOSIS — G8929 Other chronic pain: Secondary | ICD-10-CM | POA: Diagnosis not present

## 2023-09-01 DIAGNOSIS — I10 Essential (primary) hypertension: Secondary | ICD-10-CM | POA: Diagnosis not present

## 2023-09-01 DIAGNOSIS — H539 Unspecified visual disturbance: Secondary | ICD-10-CM | POA: Diagnosis not present

## 2023-09-01 DIAGNOSIS — M546 Pain in thoracic spine: Secondary | ICD-10-CM | POA: Diagnosis not present

## 2023-09-27 DIAGNOSIS — Z1211 Encounter for screening for malignant neoplasm of colon: Secondary | ICD-10-CM | POA: Diagnosis not present

## 2023-09-27 DIAGNOSIS — K219 Gastro-esophageal reflux disease without esophagitis: Secondary | ICD-10-CM | POA: Diagnosis not present

## 2023-09-27 DIAGNOSIS — K2940 Chronic atrophic gastritis without bleeding: Secondary | ICD-10-CM | POA: Diagnosis not present

## 2023-09-27 DIAGNOSIS — K648 Other hemorrhoids: Secondary | ICD-10-CM | POA: Diagnosis not present

## 2023-09-27 DIAGNOSIS — K3189 Other diseases of stomach and duodenum: Secondary | ICD-10-CM | POA: Diagnosis not present

## 2023-12-18 ENCOUNTER — Ambulatory Visit: Admission: EM | Admit: 2023-12-18 | Discharge: 2023-12-18 | Disposition: A | Discharge status: Home / Self Care

## 2023-12-18 DIAGNOSIS — L309 Dermatitis, unspecified: Secondary | ICD-10-CM | POA: Diagnosis not present

## 2023-12-18 MED ORDER — METHYLPREDNISOLONE ACETATE 80 MG/ML IJ SUSP
80.0000 mg | Freq: Once | INTRAMUSCULAR | Status: AC
  Administered 2023-12-18: 80 mg via INTRAMUSCULAR

## 2023-12-18 NOTE — Discharge Instructions (Signed)
 We have given you a steroid shot today to help with the itching and hopefully resolve the rash.  Continue the good home care that you are already doing and follow-up for worsening or unresolving symptoms.

## 2023-12-18 NOTE — ED Provider Notes (Signed)
 [facility]    CSN: [id number] Arrival date & time: 12/18/23  1351      History   Chief Complaint No chief complaint on file.   HPI Adam Yates is a 53 y.o. male.   Patient presenting today with about 3 weeks of waxing and waning rash to bilateral forehead and sides of face.  States the areas are now intensely itchy and swelling but not painful, draining, bleeding.  Denies new soaps or products, new foods or medications, new outdoor exposures, throat itching or swelling, chest tightness, shortness of breath, history of similar.  Has tried triamcinolone cream and mupirocin ointment with minimal relief.  Does also take a daily allergy tablet.    Past Medical History:  Diagnosis Date   High cholesterol     Patient Active Problem List   Diagnosis Date Noted   HLD (hyperlipidemia) 12/04/2021   Allergy to pantoprazole  12/04/2021   Nicotine abuse 12/04/2021   Chest pain of uncertain etiology 12/04/2021    Past Surgical History:  Procedure Laterality Date   NECK SURGERY  1973       Home Medications    Prior to Admission medications   Medication Sig Start Date End Date Taking? Authorizing Provider  cetirizine (ZYRTEC) 10 MG tablet Take 10 mg by mouth daily. 05/18/22  Yes [provider]  albuterol (VENTOLIN HFA) 108 (90 Base) MCG/ACT inhaler Inhale 1 puff into the lungs every 4 (four) hours as needed for shortness of breath.    [provider]  atorvastatin (LIPITOR) 20 MG tablet Take 20 mg by mouth daily. 11/07/21   [provider]  fluticasone OREN) 50 MCG/ACT nasal spray 1 spray each nostril every day 05/01/19   [provider]    Family History Family History  Problem Relation Age of Onset   Hypertension Mother    Hyperlipidemia Mother    Dementia Father     Social History Social History   Tobacco Use   Smoking status: Every Day    Current packs/day: 0.50    Types: Cigarettes   Smokeless tobacco:  Never   Tobacco comments:    5 ciggs per day  Substance Use Topics   Alcohol use: Never   Drug use: Never     Allergies   Amoxicillin, Moxifloxacin, Losartan, and Pantoprazole    Review of Systems Review of Systems Per HPI  Physical Exam Triage Vital Signs ED Triage Vitals  Encounter Vitals Group     BP 12/18/23 1438 134/78     Girls Systolic BP Percentile --      Girls Diastolic BP Percentile --      Boys Systolic BP Percentile --      Boys Diastolic BP Percentile --      Pulse Rate 12/18/23 1438 91     Resp 12/18/23 1438 18     Temp 12/18/23 1438 98.7 F (37.1 C)     Temp Source 12/18/23 1438 Oral     SpO2 12/18/23 1438 96 %     Weight --      Height --      Head Circumference --      Peak Flow --      Pain Score 12/18/23 1439 0     Pain Loc --      Pain Education --      Exclude from Growth Chart --    No data found.  Updated Vital Signs BP 134/78 (BP Location: Right Arm)   Pulse 91  Temp 98.7 F (37.1 C) (Oral)   Resp 18   SpO2 96%   Visual Acuity Right Eye Distance:   Left Eye Distance:   Bilateral Distance:    Right Eye Near:   Left Eye Near:    Bilateral Near:     Physical Exam Vitals and nursing note reviewed.  Constitutional:      Appearance: Normal appearance.  HENT:     Head: Atraumatic.  Eyes:     Extraocular Movements: Extraocular movements intact.     Conjunctiva/sclera: Conjunctivae normal.  Cardiovascular:     Rate and Rhythm: Normal rate.  Pulmonary:     Effort: Pulmonary effort is normal.  Musculoskeletal:        General: Normal range of motion.     Cervical back: Normal range of motion and neck supple.  Skin:    General: Skin is warm.     Comments: Erythematous papular lesions to forehead and temples bilaterally  Neurological:     General: No focal deficit present.     Mental Status: He is oriented to person, place, and time.  Psychiatric:        Mood and Affect: Mood normal.        Thought Content: Thought content  normal.        Judgment: Judgment normal.      UC Treatments / Results  Labs (all labs ordered are listed, but only abnormal results are displayed) Labs Reviewed - No data to display  EKG   Radiology No results found.  Procedures Procedures (including critical care time)  Medications Ordered in UC Medications  methylPREDNISolone acetate (DEPO-MEDROL) injection 80 mg (80 mg Intramuscular Given 12/18/23 1515)    Initial Impression / Assessment and Plan / UC Course  I have reviewed the triage vital signs and the nursing notes.  Pertinent labs & imaging results that were available during my care of the patient were reviewed by me and considered in my medical decision making (see chart for details).     Treat with IM Solu-Medrol for itching, inflammation and continue antihistamines, topical care as he has been.  Follow-up for worsening or unresolving symptoms.  Final Clinical Impressions(s) / UC Diagnoses   Final diagnoses:  Facial dermatitis     Discharge Instructions      We have given you a steroid shot today to help with the itching and hopefully resolve the rash.  Continue the good home care that you are already doing and follow-up for worsening or unresolving symptoms.    ED Prescriptions   None    PDMP not reviewed this encounter.   Stuart Vernell Norris, [place] 12/18/23 1535

## 2023-12-18 NOTE — ED Triage Notes (Signed)
 Pt reports rash on the right side of the face x 3 weeks started off small and has gotten bigger. Pt has been using mupirocin ointment and triamcinolone cream. Pt first noticed after mowing how lawn about 3 weeks ago. Does daily allergy regimen.

## 2023-12-20 IMAGING — CR DG CHEST 2V
2 series · 2 of 2 positions shown · non-contrast
Comparison: No prior.

CLINICAL DATA: History of cough.

EXAM:
CHEST - 2 VIEW

[w pa chest]
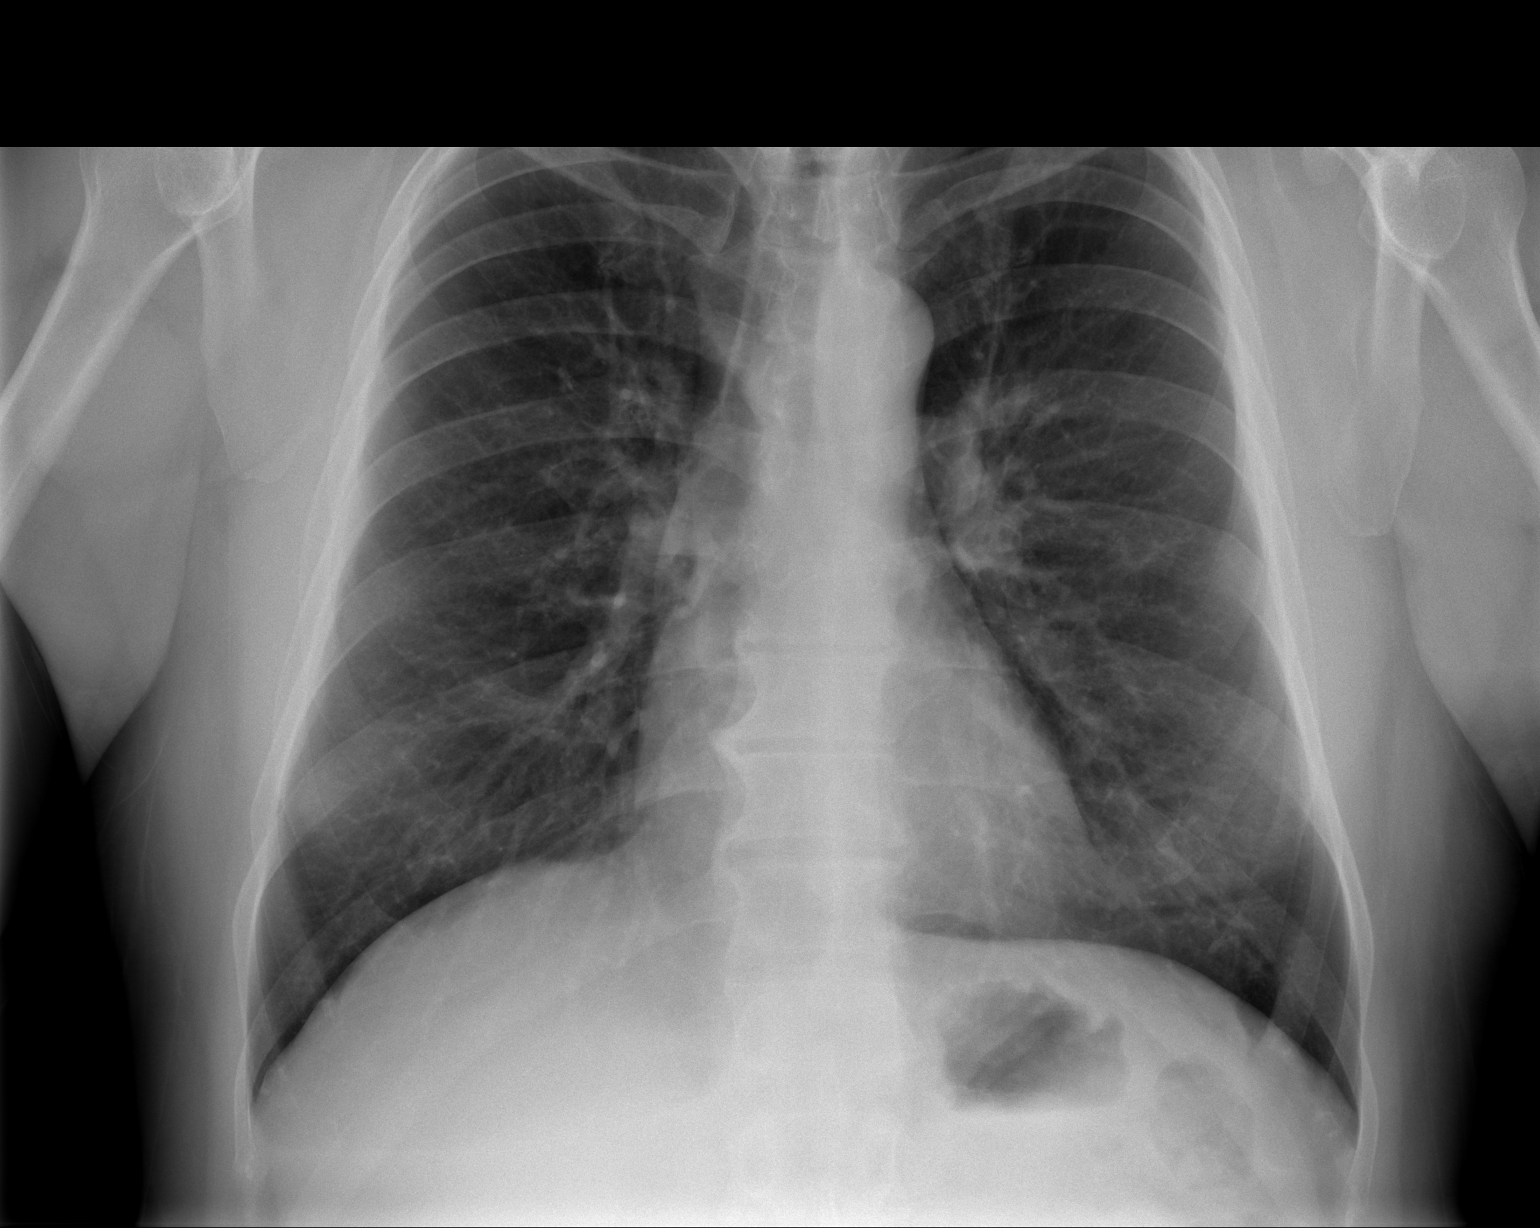

[w chest lat]
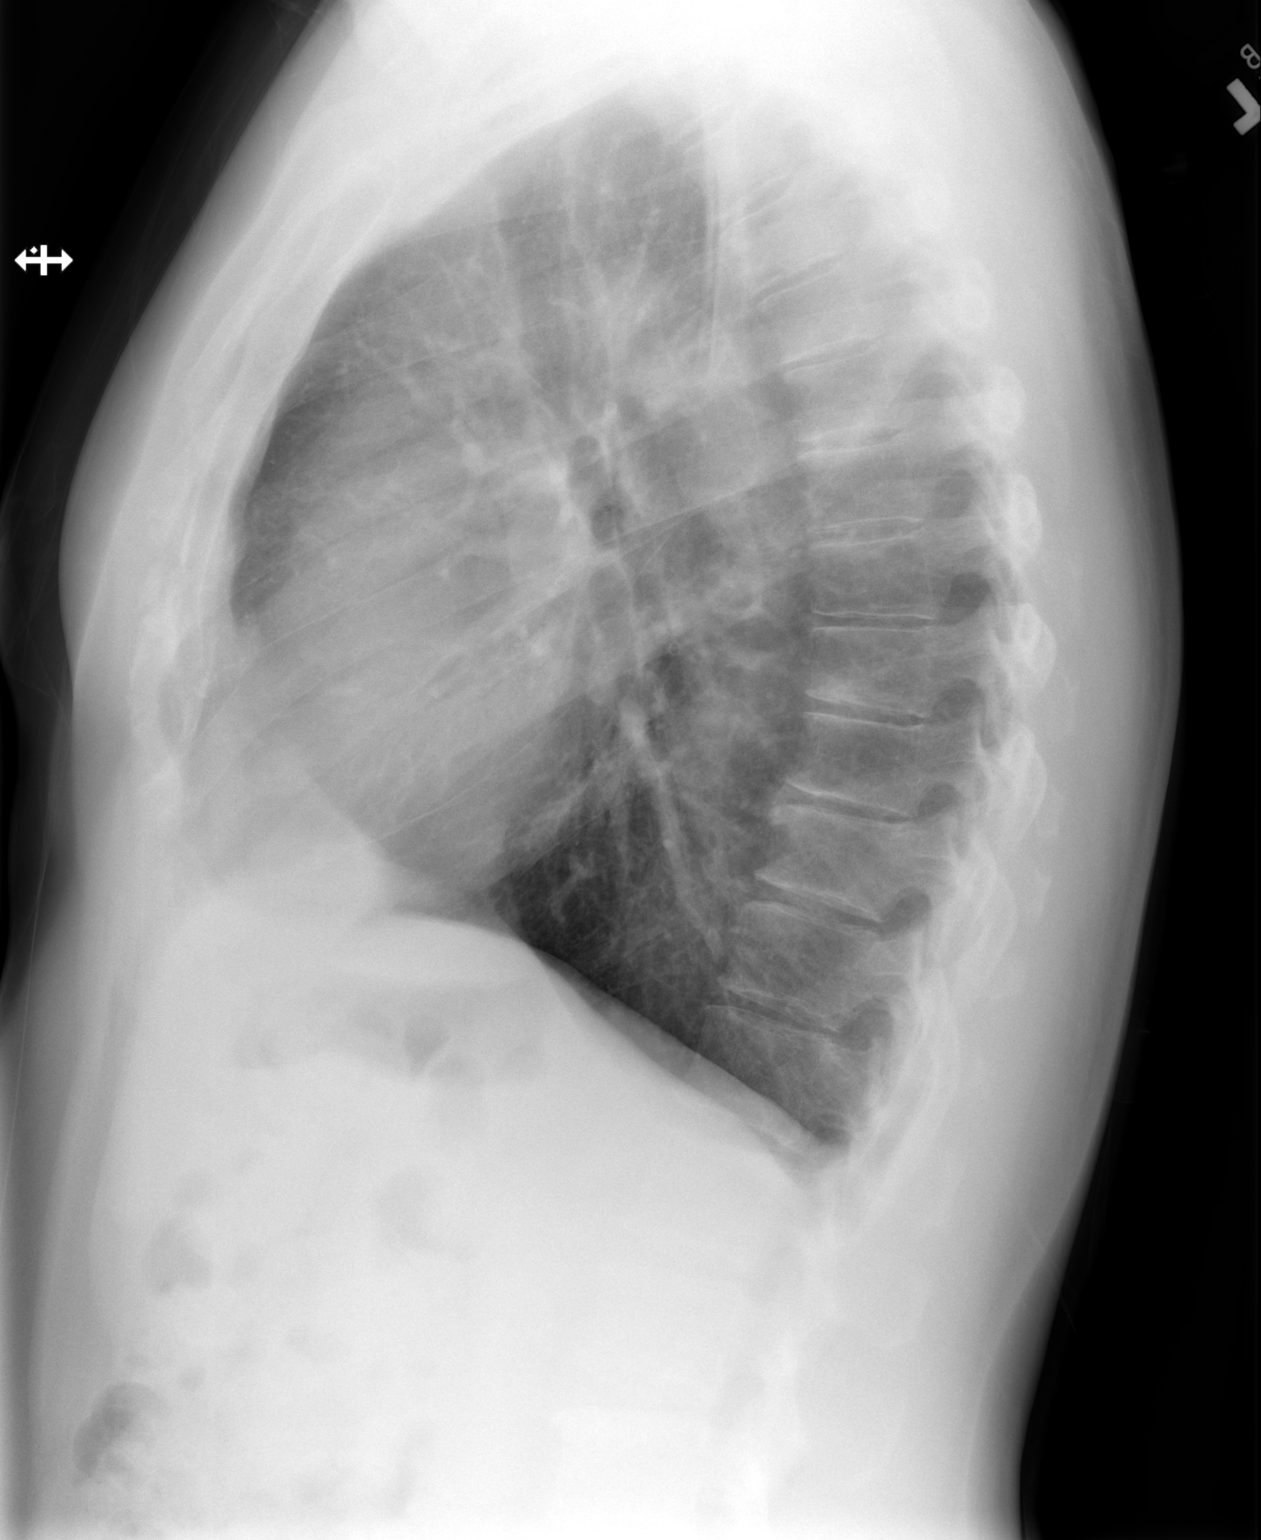

[2 of 2 positions shown; findings below may reference images not displayed]

FINDINGS: Mediastinum and hilar structures normal. Heart size normal. Mild
peribronchial thickening noted. Bronchitis cannot be excluded. No
focal infiltrate. No pleural effusion or pneumothorax. Degenerative
change thoracic spine.
IMPRESSION: Mild peribronchial thickening noted. Bronchitis cannot be excluded.
No focal infiltrate.
